# Patient Record
Sex: Female | Born: 1965
Health system: Southern US, Community
[De-identification: ages and names within clinical notes are randomized; demographics above are authoritative.]

## PROBLEM LIST (undated history)

## (undated) DIAGNOSIS — F329 Major depressive disorder, single episode, unspecified: Secondary | ICD-10-CM

## (undated) DIAGNOSIS — F32A Depression, unspecified: Secondary | ICD-10-CM

## (undated) DIAGNOSIS — T1491XA Suicide attempt, initial encounter: Secondary | ICD-10-CM

## (undated) DIAGNOSIS — K649 Unspecified hemorrhoids: Secondary | ICD-10-CM

## (undated) DIAGNOSIS — F419 Anxiety disorder, unspecified: Secondary | ICD-10-CM

## (undated) DIAGNOSIS — F1010 Alcohol abuse, uncomplicated: Secondary | ICD-10-CM

## (undated) DIAGNOSIS — G40909 Epilepsy, unspecified, not intractable, without status epilepticus: Secondary | ICD-10-CM

## (undated) HISTORY — PX: EXTERNAL EAR SURGERY: SHX627

## (undated) HISTORY — DX: Epilepsy, unspecified, not intractable, without status epilepticus: G40.909

## (undated) HISTORY — PX: HEAD & NECK SKIN LESION EXCISIONAL BIOPSY: SUR472

## (undated) HISTORY — PX: WISDOM TOOTH EXTRACTION: SHX21

---

## 2000-10-22 ENCOUNTER — Other Ambulatory Visit: Admission: RE | Admit: 2000-10-22 | Discharge: 2000-10-22 | Payer: Self-pay | Admitting: Obstetrics and Gynecology

## 2001-10-26 ENCOUNTER — Other Ambulatory Visit: Admission: RE | Admit: 2001-10-26 | Discharge: 2001-10-26 | Payer: Self-pay | Admitting: Gynecology

## 2002-08-03 ENCOUNTER — Encounter: Admission: RE | Admit: 2002-08-03 | Discharge: 2002-09-05 | Payer: Self-pay | Admitting: Family Medicine

## 2002-11-08 ENCOUNTER — Other Ambulatory Visit: Admission: RE | Admit: 2002-11-08 | Discharge: 2002-11-08 | Payer: Self-pay | Admitting: Gynecology

## 2005-03-18 ENCOUNTER — Other Ambulatory Visit: Admission: RE | Admit: 2005-03-18 | Discharge: 2005-03-18 | Payer: Self-pay | Admitting: Gynecology

## 2006-04-22 ENCOUNTER — Other Ambulatory Visit: Admission: RE | Admit: 2006-04-22 | Discharge: 2006-04-22 | Payer: Self-pay | Admitting: Gynecology

## 2006-05-21 ENCOUNTER — Encounter: Admission: RE | Admit: 2006-05-21 | Discharge: 2006-05-21 | Payer: Self-pay | Admitting: Obstetrics and Gynecology

## 2007-05-25 ENCOUNTER — Other Ambulatory Visit: Admission: RE | Admit: 2007-05-25 | Discharge: 2007-05-25 | Payer: Self-pay | Admitting: Gynecology

## 2008-06-21 ENCOUNTER — Ambulatory Visit: Payer: Self-pay | Admitting: Women's Health

## 2008-06-21 ENCOUNTER — Encounter: Payer: Self-pay | Admitting: Women's Health

## 2008-06-21 ENCOUNTER — Other Ambulatory Visit: Admission: RE | Admit: 2008-06-21 | Discharge: 2008-06-21 | Payer: Self-pay | Admitting: Gynecology

## 2008-12-26 ENCOUNTER — Emergency Department (HOSPITAL_COMMUNITY): Admission: EM | Admit: 2008-12-26 | Discharge: 2008-12-26 | Payer: Self-pay | Admitting: Emergency Medicine

## 2009-01-08 ENCOUNTER — Ambulatory Visit (HOSPITAL_COMMUNITY): Admission: RE | Admit: 2009-01-08 | Discharge: 2009-01-08 | Payer: Self-pay | Admitting: General Practice

## 2009-05-23 ENCOUNTER — Emergency Department (HOSPITAL_COMMUNITY): Admission: EM | Admit: 2009-05-23 | Discharge: 2009-05-23 | Payer: Self-pay | Admitting: Family Medicine

## 2009-05-24 ENCOUNTER — Inpatient Hospital Stay (HOSPITAL_COMMUNITY): Admission: EM | Admit: 2009-05-24 | Discharge: 2009-05-24 | Payer: Self-pay | Admitting: Emergency Medicine

## 2009-08-01 ENCOUNTER — Other Ambulatory Visit: Admission: RE | Admit: 2009-08-01 | Discharge: 2009-08-01 | Payer: Self-pay | Admitting: Gynecology

## 2009-08-01 ENCOUNTER — Ambulatory Visit: Payer: Self-pay | Admitting: Women's Health

## 2010-08-21 ENCOUNTER — Encounter: Payer: Self-pay | Admitting: Women's Health

## 2010-08-28 ENCOUNTER — Encounter: Payer: Self-pay | Admitting: Women's Health

## 2010-09-11 ENCOUNTER — Other Ambulatory Visit (HOSPITAL_COMMUNITY)
Admission: RE | Admit: 2010-09-11 | Discharge: 2010-09-11 | Disposition: A | Discharge status: Home / Self Care | Payer: BC Managed Care – PPO | Source: Ambulatory Visit | Attending: Gynecology | Admitting: Gynecology

## 2010-09-11 ENCOUNTER — Other Ambulatory Visit: Payer: Self-pay | Admitting: Women's Health

## 2010-09-11 ENCOUNTER — Encounter (INDEPENDENT_AMBULATORY_CARE_PROVIDER_SITE_OTHER): Payer: BC Managed Care – PPO | Admitting: Women's Health

## 2010-09-11 DIAGNOSIS — N912 Amenorrhea, unspecified: Secondary | ICD-10-CM

## 2010-09-11 DIAGNOSIS — Z124 Encounter for screening for malignant neoplasm of cervix: Secondary | ICD-10-CM | POA: Insufficient documentation

## 2010-09-11 DIAGNOSIS — R82998 Other abnormal findings in urine: Secondary | ICD-10-CM

## 2010-09-11 DIAGNOSIS — Z01419 Encounter for gynecological examination (general) (routine) without abnormal findings: Secondary | ICD-10-CM

## 2010-10-08 LAB — COMPREHENSIVE METABOLIC PANEL
AST: 32 U/L (ref 0–37)
CO2: 25 mEq/L (ref 19–32)
Calcium: 8.7 mg/dL (ref 8.4–10.5)
Chloride: 105 mEq/L (ref 96–112)
Creatinine, Ser: 0.8 mg/dL (ref 0.4–1.2)
GFR calc Af Amer: >60 mL/min (ref >60)
Potassium: 3.3 mEq/L — ABNORMAL LOW (ref 3.5–5.1)
Sodium: 135 mEq/L (ref 135–145)
Total Protein: 6.4 g/dL (ref 6.0–8.3)

## 2010-10-08 LAB — DIFFERENTIAL
Basophils Absolute: 0 10*3/uL (ref 0.0–0.1)
Basophils Relative: 0 % (ref 0–1)
Eosinophils Absolute: 0 10*3/uL (ref 0.0–0.7)
Eosinophils Relative: 0 % (ref 0–5)
Lymphocytes Relative: 6 % — ABNORMAL LOW (ref 12–46)

## 2010-10-13 LAB — COMPREHENSIVE METABOLIC PANEL WITH GFR
ALT: 20 U/L (ref 0–35)
AST: 37 U/L (ref 0–37)
Alkaline Phosphatase: 46 U/L (ref 39–117)
Calcium: 9.2 mg/dL (ref 8.4–10.5)
GFR calc Af Amer: >60 mL/min (ref >60)
Sodium: 136 meq/L (ref 135–145)
Total Bilirubin: 1.5 mg/dL — ABNORMAL HIGH (ref 0.3–1.2)
Total Protein: 6.7 g/dL (ref 6.0–8.3)

## 2010-10-13 LAB — COMPREHENSIVE METABOLIC PANEL
Albumin: 3.9 g/dL (ref 3.5–5.2)
BUN: 5 mg/dL — ABNORMAL LOW (ref 6–23)
CO2: 24 mEq/L (ref 19–32)
Chloride: 104 mEq/L (ref 96–112)
Creatinine, Ser: 0.68 mg/dL (ref 0.4–1.2)
GFR calc non Af Amer: >60 mL/min (ref >60)
Glucose, Bld: 105 mg/dL — ABNORMAL HIGH (ref 70–99)
Potassium: 2.8 mEq/L — ABNORMAL LOW (ref 3.5–5.1)

## 2010-10-13 LAB — URINE CULTURE
Colony Count: NO GROWTH
Culture: NO GROWTH

## 2010-10-13 LAB — DIFFERENTIAL
Basophils Absolute: 0 10*3/uL (ref 0.0–0.1)
Basophils Relative: 0 % (ref 0–1)
Eosinophils Absolute: 0 10*3/uL (ref 0.0–0.7)
Eosinophils Relative: 0 % (ref 0–5)
Lymphocytes Relative: 6 % — ABNORMAL LOW (ref 12–46)
Lymphs Abs: 0.7 10*3/uL (ref 0.7–4.0)
Monocytes Absolute: 0.5 10*3/uL (ref 0.1–1.0)
Monocytes Relative: 5 % (ref 3–12)
Neutro Abs: 9.8 K/uL — ABNORMAL HIGH (ref 1.7–7.7)
Neutrophils Relative %: 89 % — ABNORMAL HIGH (ref 43–77)

## 2010-10-13 LAB — URINALYSIS, ROUTINE W REFLEX MICROSCOPIC
Bilirubin Urine: NEGATIVE
Glucose, UA: 100 mg/dL — AB
Leukocytes, UA: NEGATIVE
Nitrite: NEGATIVE
Protein, ur: NEGATIVE mg/dL
Specific Gravity, Urine: 1.025 (ref 1.005–1.030)
Urobilinogen, UA: 0.2 mg/dL (ref 0.0–1.0)
pH: 5.5 (ref 5.0–8.0)

## 2010-10-13 LAB — URINE MICROSCOPIC-ADD ON

## 2010-10-13 LAB — RAPID URINE DRUG SCREEN, HOSP PERFORMED
Amphetamines: NOT DETECTED
Barbiturates: NOT DETECTED
Benzodiazepines: NOT DETECTED
Cocaine: NOT DETECTED
Opiates: NOT DETECTED
Tetrahydrocannabinol: NOT DETECTED

## 2010-10-13 LAB — CBC
HCT: 35.5 % — ABNORMAL LOW (ref 36.0–46.0)
Hemoglobin: 11.9 g/dL — ABNORMAL LOW (ref 12.0–15.0)
MCHC: 33.7 g/dL (ref 30.0–36.0)
MCV: 100.8 fL — ABNORMAL HIGH (ref 78.0–100.0)
Platelets: 217 10*3/uL (ref 150–400)
RBC: 3.52 MIL/uL — ABNORMAL LOW (ref 3.87–5.11)
RDW: 13.1 % (ref 11.5–15.5)
WBC: 11 K/uL — ABNORMAL HIGH (ref 4.0–10.5)

## 2010-10-13 LAB — POCT PREGNANCY, URINE: Preg Test, Ur: NEGATIVE

## 2010-10-13 LAB — ETHANOL: Alcohol, Ethyl (B): <5 mg/dL (ref 0–10)

## 2010-11-18 NOTE — Procedures (Signed)
REQUESTING PHYSICIAN:  Dr. Louanna Raw.   CLINICAL HISTORY:  A 45 year old woman with suspected seizure.  EEG is  for evaluation.  The patient is described as awake and drowsy.  This is  a routine EEG done with photic stimulation and hyperventilation.   DESCRIPTION:  Dominant rhythm in this tracing is a moderate-to-high  amplitude alpha rhythm of 10 Hz, which predominates posteriorly, appears  without abnormal asymmetry, and attenuates with eye opening and closing.  Low amplitude fast activity seen frontally and centrally.  Drowsiness  occurs naturally as evidenced by occasional fragmentation in the  background and generalized attenuation of rhythms, but drowsiness is not  prolonged and stage II sleep is not seen.  Throughout the recording,  both in awake and drowsiness, and during photic stimulation and  hyperventilation, apex of high amplitude delta activity is 3-4 Hz,  frontal predominance, appear.  These are often mixed with spike and  polyspike activity, which seems to emanate primarily from frontal and  central areas.  Thus the slowing or spike activity seems to have a  lateralizing predominance.  Her paroxysms last 1-2 seconds, and then  normal background is seen.  Photic stimulation produced symmetric driver  responses.  Hyperventilation produced increased prominence of the alpha.  Single channel devoted to EKG revealed sinus rhythm throughout the rate  of approximately 78 beats per minute.   CONCLUSIONS:  Abnormal study due to the presence of paroxysms of  generalized slowing and spike/slow activity, findings suggesting  underlying cortical irritability and potential epileptogenicity.  Findings of this type are characteristic seen in individuals with  various forms of generalized epilepsy.  Careful correlation with  clinical information is required.      Michael L. Thad Ranger, M.D.  Electronically Signed     QIO:NGEX  D:  01/08/2009 23:23:43  T:  01/09/2009 04:59:40   Job #:  [id number]

## 2012-05-06 DIAGNOSIS — F1011 Alcohol abuse, in remission: Secondary | ICD-10-CM | POA: Insufficient documentation

## 2012-05-06 DIAGNOSIS — G478 Other sleep disorders: Secondary | ICD-10-CM | POA: Insufficient documentation

## 2012-05-06 DIAGNOSIS — G2581 Restless legs syndrome: Secondary | ICD-10-CM | POA: Insufficient documentation

## 2012-05-06 DIAGNOSIS — G40309 Generalized idiopathic epilepsy and epileptic syndromes, not intractable, without status epilepticus: Secondary | ICD-10-CM | POA: Insufficient documentation

## 2012-10-06 ENCOUNTER — Encounter: Payer: Self-pay | Admitting: Women's Health

## 2012-10-06 ENCOUNTER — Other Ambulatory Visit (HOSPITAL_COMMUNITY)
Admission: RE | Admit: 2012-10-06 | Discharge: 2012-10-06 | Disposition: A | Discharge status: Home / Self Care | Payer: BC Managed Care – PPO | Source: Ambulatory Visit | Attending: Obstetrics and Gynecology | Admitting: Obstetrics and Gynecology

## 2012-10-06 ENCOUNTER — Ambulatory Visit (INDEPENDENT_AMBULATORY_CARE_PROVIDER_SITE_OTHER): Payer: BC Managed Care – PPO | Admitting: Women's Health

## 2012-10-06 VITALS — BP 116/74 | Ht 64.0 in | Wt 140.0 lb

## 2012-10-06 DIAGNOSIS — G40409 Other generalized epilepsy and epileptic syndromes, not intractable, without status epilepticus: Secondary | ICD-10-CM | POA: Insufficient documentation

## 2012-10-06 DIAGNOSIS — Z309 Encounter for contraceptive management, unspecified: Secondary | ICD-10-CM

## 2012-10-06 DIAGNOSIS — B009 Herpesviral infection, unspecified: Secondary | ICD-10-CM

## 2012-10-06 DIAGNOSIS — Z01419 Encounter for gynecological examination (general) (routine) without abnormal findings: Secondary | ICD-10-CM | POA: Insufficient documentation

## 2012-10-06 DIAGNOSIS — F411 Generalized anxiety disorder: Secondary | ICD-10-CM

## 2012-10-06 DIAGNOSIS — Z13220 Encounter for screening for lipoid disorders: Secondary | ICD-10-CM

## 2012-10-06 DIAGNOSIS — G40309 Generalized idiopathic epilepsy and epileptic syndromes, not intractable, without status epilepticus: Secondary | ICD-10-CM

## 2012-10-06 DIAGNOSIS — F32A Depression, unspecified: Secondary | ICD-10-CM | POA: Insufficient documentation

## 2012-10-06 DIAGNOSIS — F329 Major depressive disorder, single episode, unspecified: Secondary | ICD-10-CM

## 2012-10-06 DIAGNOSIS — IMO0001 Reserved for inherently not codable concepts without codable children: Secondary | ICD-10-CM

## 2012-10-06 LAB — CBC WITH DIFFERENTIAL/PLATELET
Basophils Absolute: 0 10*3/uL (ref 0.0–0.1)
Basophils Relative: 1 % (ref 0–1)
Eosinophils Absolute: 0.1 10*3/uL (ref 0.0–0.7)
Eosinophils Relative: 2 % (ref 0–5)
Hemoglobin: 13.4 g/dL (ref 12.0–15.0)
MCH: 31.7 pg (ref 26.0–34.0)
Monocytes Absolute: 0.5 10*3/uL (ref 0.1–1.0)
Neutrophils Relative %: 53 % (ref 43–77)
RBC: 4.23 MIL/uL (ref 3.87–5.11)
WBC: 6.1 10*3/uL (ref 4.0–10.5)

## 2012-10-06 LAB — LIPID PANEL
Cholesterol: 266 mg/dL — ABNORMAL HIGH (ref 0–200)
HDL: 87 mg/dL (ref >39)
LDL Cholesterol: 148 mg/dL — ABNORMAL HIGH (ref 0–99)
Triglycerides: 156 mg/dL — ABNORMAL HIGH (ref <150)
VLDL: 31 mg/dL (ref 0–40)

## 2012-10-06 MED ORDER — FLUOXETINE HCL 20 MG PO CAPS: 20.0000 mg | ORAL_CAPSULE | Freq: Every day | ORAL | Status: DC

## 2012-10-06 MED ORDER — VALACYCLOVIR HCL 500 MG PO TABS: 500.0000 mg | ORAL_TABLET | Freq: Two times a day (BID) | ORAL | Status: DC

## 2012-10-06 MED ORDER — NORETHINDRONE ACET-ETHINYL EST 1-20 MG-MCG PO TABS: 1.0000 | ORAL_TABLET | Freq: Every day | ORAL | Status: DC

## 2012-10-06 NOTE — Patient Instructions (Addendum)

## 2012-10-06 NOTE — Progress Notes (Signed)
Robin Henderson [birth year] [id number]    History:    The patient presents for annual exam.  Monthly cycle/same partner/rare intercourse. Normal screening mammogram in 07. Normal Pap history. HSV 1, rare outbreaks. History of depression, off prozac for 2 years, requests restarting Prozac. History of alcohol abuse, no alcohol for several years. Had been on Prozac 60 mg daily 2 years ago,  has had counseling. Seizure disorder/myoclonic epilepsy, on Lamictal 2010 Dr. Sunday Shams manages.   Past medical history, past surgical history, family history and social history were all reviewed and documented in the EPIC chart. 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   ROS:  A  ROS was performed and pertinent positives and negatives are included in the history.  Exam:  Filed Vitals:   10/06/12 1018  BP: 116/74    General appearance:  Normal Head/Neck:  Normal, without cervical or supraclavicular adenopathy. Thyroid:  Symmetrical, normal in size, without palpable masses or nodularity. Respiratory  Effort:  Normal  Auscultation:  Clear without wheezing or rhonchi Cardiovascular  Auscultation:  Regular rate, without rubs, murmurs or gallops  Edema/varicosities:  Not grossly evident Abdominal  Soft,nontender, without masses, guarding or rebound.  Liver/spleen:  No organomegaly noted  Hernia:  None appreciated  Skin  Inspection:  Grossly normal  Palpation:  Grossly normal Neurologic/psychiatric  Orientation:  Normal with appropriate conversation.  Mood/affect:  Normal  Genitourinary    Breasts: Examined lying and sitting.     Right: Without masses, retractions, discharge or axillary adenopathy.     Left: Without masses, retractions, discharge or axillary adenopathy.   Inguinal/mons:  Normal without inguinal adenopathy  External genitalia:  Normal  BUS/Urethra/Skene's glands:  Normal  Bladder:  Normal  Vagina:  Normal  Cervix:  Normal  Uterus:   normal in size, shape and contour.   Midline and mobile  Adnexa/parametria:     Rt: Without masses or tenderness.   Lt: Without masses or tenderness.  Anus and perineum: Normal  Digital rectal exam: Normal sphincter tone without palpated masses or tenderness  Assessment/Plan:  47 y.o. DWF G0  for annual exam.   HSV 1 Depression Contraception management  Plan: Counseling reviewed, declines need. Prozac 20 mg by mouth daily, continue regular exercise, healthy diet. Instructed to call if no relief of symptoms. SBE's, importance of an annual screen reviewed, instructed to schedule. Calcium rich diet, MVI daily encouraged. Contraception options reviewed, had been on trivora for many years, will try Loestrin 1/20 prescription, proper use, slight risk for blood clots, strokes reviewed and accepted. Condoms first month encouraged. CBC,  lipid panel, TSH, UA, Pap. Valtrex 500 twice daily for 3-5 days as needed prescription, proper use given and reviewed.    Harrington Challenger [facility], 12:33 PM 10/06/2012

## 2012-10-06 NOTE — Addendum Note (Signed)
Addended by: Richardson Chiquito on: 10/06/2012 03:02 PM   Modules accepted: Orders

## 2012-10-07 LAB — URINALYSIS W MICROSCOPIC + REFLEX CULTURE
Glucose, UA: NEGATIVE mg/dL
Specific Gravity, Urine: 1.028 (ref 1.005–1.030)
pH: 5.5 (ref 5.0–8.0)

## 2012-10-13 ENCOUNTER — Other Ambulatory Visit: Payer: Self-pay | Admitting: Women's Health

## 2012-10-13 DIAGNOSIS — E7800 Pure hypercholesterolemia, unspecified: Secondary | ICD-10-CM

## 2012-11-16 ENCOUNTER — Telehealth: Payer: Self-pay | Admitting: *Deleted

## 2012-11-16 DIAGNOSIS — N390 Urinary tract infection, site not specified: Secondary | ICD-10-CM

## 2012-11-16 MED ORDER — SULFAMETHOXAZOLE-TRIMETHOPRIM 800-160 MG PO TABS: 1.0000 | ORAL_TABLET | Freq: Two times a day (BID) | ORAL | Status: DC

## 2012-11-16 NOTE — Telephone Encounter (Signed)
Pt called requesting medication for UTI, c/o urgency, burning with urination, lower back discomfort. Pt said you normally would give her a rx for this yearly but she has not had a UTI in some time now. Please advise

## 2012-11-16 NOTE — Telephone Encounter (Signed)
Best if we can check UA, but if unable Septra twice daily for 3 days.check TOC after antibiotic

## 2012-11-16 NOTE — Telephone Encounter (Signed)
rx sent to pharmacy, pt couldn't come today, order place for recheck she will make lab appt for recheck.

## 2012-11-18 ENCOUNTER — Telehealth: Payer: Self-pay | Admitting: Neurology

## 2012-11-21 ENCOUNTER — Ambulatory Visit (INDEPENDENT_AMBULATORY_CARE_PROVIDER_SITE_OTHER): Payer: BC Managed Care – PPO | Admitting: Neurology

## 2012-11-21 ENCOUNTER — Encounter: Payer: Self-pay | Admitting: Neurology

## 2012-11-21 ENCOUNTER — Telehealth: Payer: Self-pay | Admitting: Neurology

## 2012-11-21 VITALS — BP 136/81 | HR 85 | Ht 65.0 in | Wt 134.0 lb

## 2012-11-21 DIAGNOSIS — G40309 Generalized idiopathic epilepsy and epileptic syndromes, not intractable, without status epilepticus: Secondary | ICD-10-CM

## 2012-11-21 DIAGNOSIS — G40409 Other generalized epilepsy and epileptic syndromes, not intractable, without status epilepticus: Secondary | ICD-10-CM

## 2012-11-21 MED ORDER — LAMOTRIGINE ER 200 MG PO TB24: 200.0000 mg | ORAL_TABLET | ORAL | Status: DC

## 2012-11-21 NOTE — Telephone Encounter (Signed)
I spoke with patient and she is coming in today at 1430 to see Dr. Vickey Huger.

## 2012-11-21 NOTE — Progress Notes (Addendum)
[facility]  Provider:  Dr Arma Reining Referring Provider:Primary Care Physician:  Sabino Snipes . np  Chief Complaint  Patient presents with  . Seizures    RM#10    HPI:  Robin Henderson is a 47 y.o. female here as a referral from Dr. Maryelizabeth Rowan . Patient is currently on Prozac, 20 mg daily Lamictal 100 mg by mouth daily Loestrin 1/20 mg microgram. Bactrim 800 mg twice daily beginning 11/16/2012 and Valtrex as needed 5 mg patient. The patient has been seen for about 4/2 years of this practice in the diet after she initially had a cluster of several seizures or. 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 She was briefly hospitalized once for seizures and her EEG showed abnormal generalized discharges as polyspike wave pattern.  She presents today after 25 months of seizure freedom With another seizure that happened in her whole, she relates that at the same time she suffered a urinary tract infection, was dehydrated , not feeling well and had been placed on Cipro- which may have also played a roll in her metabolism. Felt to the  ground and scratched her right knee in the seizure and had to abrasions to her right face at the eyebrow and the zygomatic arch.  The emergency services came to the home  but did not recommend to seek further care at the emergency room. She was again placed on driving restrictions for 6 month. She understood that she needs to h be seizure free for that period.     Review of Systems: Out of a complete 14 system review, the patient complains of only the following symptoms, and all other reviewed systems are negative. Recent seizure , after Vomiting, fever and chills , insomnia,  UTI ,dehydration.    History   Social History  . Marital Status: Single    Spouse Name: N/A    Number of Children: N/A  . Years of Education: N/A    Occupational History  . Not on file.   Social History Main Topics  . Smoking status: Never Smoker   . Smokeless tobacco: Never Used  . Alcohol Use: No  . Drug Use: Not on file  . Sexually Active: Not Currently    Birth Control/ Protection: None   Other Topics Concern  . Not on file   Social History Narrative  . No narrative on file    Family History  Problem Relation Age of Onset  . Heart disease Mother   . Epilepsy Sister   . Epilepsy Brother     Past Medical History  Diagnosis Date  . Epilepsy     No past surgical history on file.  Current Outpatient Prescriptions  Medication Sig Dispense Refill  . FLUoxetine (PROZAC) 20 MG capsule Take 1 capsule (20 mg total) by mouth daily.  30 capsule  11  . lamoTRIgine (LAMICTAL) 100 MG tablet Take 100 mg by mouth daily.      . norethindrone-ethinyl estradiol (MICROGESTIN,JUNEL,LOESTRIN) 1-20 MG-MCG tablet Take 1 tablet by mouth daily.  1 Package  12  . sulfamethoxazole-trimethoprim (BACTRIM DS,SEPTRA DS) 800-160 MG per tablet Take 1 tablet by mouth 2 (two) times daily.  6 tablet  0  . valACYclovir (VALTREX) 500 MG tablet Take 500 mg by mouth as needed.       No current facility-administered medications for this visit.    Allergies  as of 11/21/2012  . (No Known Allergies)    Vitals: BP 136/81  Pulse 85  Ht 5\' 5"  (1.651 m)  Wt 134 lb (60.782 kg)  BMI 22.3 kg/m2 Last Weight:  Wt Readings from Last 1 Encounters:  11/21/12 134 lb (60.782 kg)   Last Height:   Ht Readings from Last 1 Encounters:  11/21/12 5\' 5"  (1.651 m)   Vision Screening:   Physical exam:  General: The patient is awake, alert and appears not in acute distress. The patient is well groomed. Head: Normocephalic, atraumatic. Neck is supple. Mallampati1,  Cardiovascular:  Regular rate and rhythm, without  murmurs or carotid bruit, and without distended neck veins. Respiratory: Lungs are clear to auscultation. Skin:  Without evidence of edema, or  rash- has bruising over the right  knee.  tanned.  Trunk: BMI is normal.  Neurologic exam : The patient is awake and alert, oriented to place and time.  Memory subjective  described as intact. There is a normal attention span & concentration ability. Speech is fluent without  dysarthria, dysphonia or aphasia.  Mood and affect are appropriate.  Cranial nerves: Pupils are equal and briskly reactive to light. Funduscopic exam without  evidence of pallor or edema. Extraocular movements  in vertical and horizontal planes intact and without nystagmus. Visual fields by finger perimetry are intact. Hearing to finger rub intact.  Facial sensation intact to fine touch. Facial motor strength is symmetric and tongue and uvula move midline. No tongue bite   Motor exam: Normal tone and normal muscle bulk and symmetric normal strength in all extremities.  Sensory:  Fine touch, pinprick and vibration were tested in all extremities. Proprioception is tested in the upper extremities only.   Coordination: Rapid alternating movements in the fingers/hands is tested and normal. Finger-to-nose maneuver tested and normal without evidence of ataxia, dysmetria or tremor.  Gait and station: Patient walks without assistive device and is able and assisted stool climb up to the exam table. Strength within normal limits. Stance is stable and normal.  Steps are unfragmented. Romberg testing is normal.  Deep tendon reflexes: in the  upper and lower extremities are symmetric and intact. Babinski maneuver response is  downgoing.   Assessment:  After physical and neurologic examination, review of laboratory studies, imaging, neurophysiology testing and pre-existing records, assessment will be reviewed on the problem list.  Plan:  Treatment plan and additional workup will be reviewed under Problem List.   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  Lamictal to be increased to 150 mg daily.  She is not  drinking .

## 2012-11-21 NOTE — Telephone Encounter (Signed)
I spoke to patient who had a seizure on Thurs of last week, with lots of vomiting.  She has not had a seizure in 25 months.  She said she has lost about 24lbs, and is going through a lot of stress in her personal life, also got a UTI and was prescribed Cipro by her PCP.  She wants to know if she can come in to see Dr. Vickey Huger soon, she is traveling tomorrow afternoon to [place] until Friday.

## 2012-11-21 NOTE — Patient Instructions (Addendum)
Seizure, Adult A seizure means there is unusual activity in the brain. A seizure can cause changes in attention or behavior. Seizures often cause shaking (convulsions). Seizures often last from 30 seconds to 2 minutes. HOME CARE   If you are given medicines, take them exactly as told by your doctor.  Keep all doctor visits as told.  Do not swim or drive until your doctor says it is okay.  Teach others what to do if you have a seizure. They should:  Lay you on the ground.  Put a cushion under your head.  Loosen any tight clothing around your neck.  Turn you on your side.  Stay with you until you get better. GET HELP RIGHT AWAY IF:   The seizure lasts longer than 2 to 5 minutes.  The seizure is very bad.  The person does not wake up after the seizure.  The person's attention or behavior changes. Drive the person to the emergency room or call your local emergency services (911 in [place] MAKE SURE YOU:   Understand these instructions.  Will watch your condition.  Will get help right away if you are not doing well or get worse. Document Released: 12/09/2007 Document Revised: 09/14/2011 Document Reviewed: 06/10/2011 [facility] Patient Information 2013 [place], [place]. Driving and Equipment Restrictions Some medical problems make it dangerous to drive, ride a bike, or use machines. Some of these problems are:  A hard blow to the head (concussion).  Passing out (fainting).  Twitching and shaking (seizures).  Low blood sugar.  Taking medicine to help you relax (sedatives).  Taking pain medicines.  Wearing an eye patch.  Wearing splints. This can make it hard to use parts of your body that you need to drive safely. HOME CARE   Do not drive until your doctor says it is okay.  Do not use machines until your doctor says it is okay. You may need a form signed by your doctor (medical release) before you can drive again. You may also need this form before you do other  tasks where you need to be fully alert. MAKE SURE YOU:  Understand these instructions.  Will watch your condition.  Will get help right away if you are not doing well or get worse. Document Released: 07/30/2004 Document Revised: 09/14/2011 Document Reviewed: 10/30/2009 [facility] Patient Information 2013 [place], [place].

## 2012-11-21 NOTE — Telephone Encounter (Signed)
Patient coming in today 11-21-12 at 1430 to see Dr. Vickey Huger.

## 2012-11-21 NOTE — Telephone Encounter (Signed)
Robin Henderson . Please call patient with appointment [place] this PM , 14.30 hours. Dae Highley, MD

## 2012-11-23 ENCOUNTER — Telehealth: Payer: Self-pay

## 2012-11-23 ENCOUNTER — Telehealth: Payer: Self-pay | Admitting: Women's Health

## 2012-11-23 DIAGNOSIS — G40309 Generalized idiopathic epilepsy and epileptic syndromes, not intractable, without status epilepticus: Secondary | ICD-10-CM

## 2012-11-23 DIAGNOSIS — G40409 Other generalized epilepsy and epileptic syndromes, not intractable, without status epilepticus: Secondary | ICD-10-CM

## 2012-11-23 NOTE — Telephone Encounter (Signed)
[facility] pharmacy sent a fax asking to verify the Lamotrigine Rx.  OV note says patient will increase to 150mg  daily.  Rx sent was for 200mg  1 po 1 day or 1 dose.  Call back number [phone] or updated rx can be e-scribed or faxed to [phone].  Please advise.

## 2012-11-23 NOTE — Telephone Encounter (Signed)
Patient will use up her current 100 mg tabs by increasing the dose to 150 ( 1 and one half tab ) a day. Than refill at 200 mg daily .

## 2012-11-23 NOTE — Telephone Encounter (Signed)
Message left,  had seizure

## 2012-11-24 MED ORDER — LAMOTRIGINE ER 200 MG PO TB24: 200.0000 mg | ORAL_TABLET | Freq: Every day | ORAL | Status: DC

## 2012-11-24 NOTE — Progress Notes (Signed)
Message left to call if needed

## 2013-01-30 ENCOUNTER — Telehealth: Payer: Self-pay | Admitting: *Deleted

## 2013-01-30 MED ORDER — HYDROCORTISONE ACE-PRAMOXINE 1-1 % RE CREA: TOPICAL_CREAM | Freq: Two times a day (BID) | RECTAL | Status: DC

## 2013-01-30 NOTE — Telephone Encounter (Signed)
Pt called c/o very bad Hemorrhoids, she has had them for years. Pt said the one now that has flared up is painful, hurts to lay down, sitting. Pt asked there is any female MD she could see for this? And also if any Rx that you could give her to help with relief? She has tried the OTC and no relief. Please advise

## 2013-01-30 NOTE — Telephone Encounter (Signed)
Please call and Analpram HC use 3 times per day, [facility] prescription with refill. Dr. Loreta Ave is female GI, often is a Careers adviser who removes hemorrhoids.

## 2013-01-30 NOTE — Telephone Encounter (Signed)
Pt informed with the below note, # given for Dr.Mann, rx sent.

## 2013-02-07 ENCOUNTER — Telehealth: Payer: Self-pay | Admitting: Neurology

## 2013-02-10 NOTE — Telephone Encounter (Signed)
Patient says she had a sz on Tue. She just wanted to inform Dr. Vickey Huger. She says she was traveling, missed taking medication 3 times, was not sleeping or eating well, and did have caffeine (coffee), which she does not have normally. She has not had another sz since.

## 2013-02-14 ENCOUNTER — Telehealth: Payer: Self-pay | Admitting: *Deleted

## 2013-02-14 NOTE — Telephone Encounter (Signed)
(  Pt aware you are out of the office) she would like to talk about increasing her Prozac 20 mg. She just got out of a 6 year relationship. Please advise

## 2013-02-20 NOTE — Telephone Encounter (Signed)
Message left on cell and work number

## 2013-02-21 ENCOUNTER — Other Ambulatory Visit: Payer: Self-pay | Admitting: Women's Health

## 2013-02-21 DIAGNOSIS — F411 Generalized anxiety disorder: Secondary | ICD-10-CM

## 2013-02-21 MED ORDER — FLUOXETINE HCL 20 MG PO CAPS: 60.0000 mg | ORAL_CAPSULE | Freq: Every day | ORAL | Status: DC

## 2013-02-21 NOTE — Telephone Encounter (Signed)
Telephone call, reviewed counseling, will schedule appointment, [place] [facility] number given, has had counseling in the past but would like to try a new counselor. Has been on Prozac 60, will call in refill. Denies suicidal ideation.

## 2013-04-27 ENCOUNTER — Inpatient Hospital Stay (HOSPITAL_COMMUNITY)
Admission: EM | Admit: 2013-04-27 | Discharge: 2013-04-30 | DRG: 897 | Disposition: A | Discharge status: Home / Self Care | Payer: BC Managed Care – PPO | Attending: Internal Medicine | Admitting: Internal Medicine

## 2013-04-27 ENCOUNTER — Encounter (HOSPITAL_COMMUNITY): Payer: Self-pay | Admitting: Emergency Medicine

## 2013-04-27 DIAGNOSIS — F1020 Alcohol dependence, uncomplicated: Secondary | ICD-10-CM | POA: Diagnosis present

## 2013-04-27 DIAGNOSIS — G40409 Other generalized epilepsy and epileptic syndromes, not intractable, without status epilepticus: Secondary | ICD-10-CM

## 2013-04-27 DIAGNOSIS — G2581 Restless legs syndrome: Secondary | ICD-10-CM

## 2013-04-27 DIAGNOSIS — F341 Dysthymic disorder: Secondary | ICD-10-CM

## 2013-04-27 DIAGNOSIS — G478 Other sleep disorders: Secondary | ICD-10-CM

## 2013-04-27 DIAGNOSIS — R569 Unspecified convulsions: Secondary | ICD-10-CM

## 2013-04-27 DIAGNOSIS — F32A Depression, unspecified: Secondary | ICD-10-CM | POA: Diagnosis present

## 2013-04-27 DIAGNOSIS — T1491XA Suicide attempt, initial encounter: Secondary | ICD-10-CM | POA: Insufficient documentation

## 2013-04-27 DIAGNOSIS — F1010 Alcohol abuse, uncomplicated: Secondary | ICD-10-CM | POA: Insufficient documentation

## 2013-04-27 DIAGNOSIS — E8729 Other acidosis: Secondary | ICD-10-CM

## 2013-04-27 DIAGNOSIS — E872 Acidosis, unspecified: Secondary | ICD-10-CM | POA: Diagnosis present

## 2013-04-27 DIAGNOSIS — R404 Transient alteration of awareness: Secondary | ICD-10-CM | POA: Diagnosis present

## 2013-04-27 DIAGNOSIS — R7309 Other abnormal glucose: Secondary | ICD-10-CM | POA: Diagnosis present

## 2013-04-27 DIAGNOSIS — F10239 Alcohol dependence with withdrawal, unspecified: Secondary | ICD-10-CM

## 2013-04-27 DIAGNOSIS — F411 Generalized anxiety disorder: Secondary | ICD-10-CM | POA: Diagnosis present

## 2013-04-27 DIAGNOSIS — F10939 Alcohol use, unspecified with withdrawal, unspecified: Principal | ICD-10-CM

## 2013-04-27 DIAGNOSIS — R739 Hyperglycemia, unspecified: Secondary | ICD-10-CM

## 2013-04-27 DIAGNOSIS — D7589 Other specified diseases of blood and blood-forming organs: Secondary | ICD-10-CM

## 2013-04-27 DIAGNOSIS — G40309 Generalized idiopathic epilepsy and epileptic syndromes, not intractable, without status epilepticus: Secondary | ICD-10-CM | POA: Diagnosis present

## 2013-04-27 DIAGNOSIS — E876 Hypokalemia: Secondary | ICD-10-CM

## 2013-04-27 DIAGNOSIS — F329 Major depressive disorder, single episode, unspecified: Secondary | ICD-10-CM | POA: Diagnosis present

## 2013-04-27 DIAGNOSIS — F3289 Other specified depressive episodes: Secondary | ICD-10-CM | POA: Diagnosis present

## 2013-04-27 DIAGNOSIS — B009 Herpesviral infection, unspecified: Secondary | ICD-10-CM

## 2013-04-27 DIAGNOSIS — I10 Essential (primary) hypertension: Secondary | ICD-10-CM | POA: Diagnosis present

## 2013-04-27 DIAGNOSIS — F1011 Alcohol abuse, in remission: Secondary | ICD-10-CM

## 2013-04-27 DIAGNOSIS — E785 Hyperlipidemia, unspecified: Secondary | ICD-10-CM | POA: Diagnosis present

## 2013-04-27 DIAGNOSIS — G40909 Epilepsy, unspecified, not intractable, without status epilepticus: Secondary | ICD-10-CM | POA: Insufficient documentation

## 2013-04-27 HISTORY — DX: Alcohol abuse, uncomplicated: F10.10

## 2013-04-27 HISTORY — DX: Unspecified hemorrhoids: K64.9

## 2013-04-27 HISTORY — DX: Suicide attempt, initial encounter: T14.91XA

## 2013-04-27 HISTORY — DX: Depression, unspecified: F32.A

## 2013-04-27 HISTORY — DX: Major depressive disorder, single episode, unspecified: F32.9

## 2013-04-27 HISTORY — DX: Anxiety disorder, unspecified: F41.9

## 2013-04-27 LAB — CBC
HCT: 34.8 % — ABNORMAL LOW (ref 36.0–46.0)
Hemoglobin: 12.3 g/dL (ref 12.0–15.0)
MCHC: 35.3 g/dL (ref 30.0–36.0)
MCV: 102.7 fL — ABNORMAL HIGH (ref 78.0–100.0)
RDW: 14.1 % (ref 11.5–15.5)
WBC: 9.9 10*3/uL (ref 4.0–10.5)

## 2013-04-27 LAB — BASIC METABOLIC PANEL
BUN: 10 mg/dL (ref 6–23)
CO2: 18 mEq/L — ABNORMAL LOW (ref 19–32)
Creatinine, Ser: 0.72 mg/dL (ref 0.50–1.10)
GFR calc Af Amer: >90 mL/min (ref >90)
GFR calc non Af Amer: >90 mL/min (ref >90)
Glucose, Bld: 150 mg/dL — ABNORMAL HIGH (ref 70–99)
Potassium: 3.6 mEq/L (ref 3.5–5.1)

## 2013-04-27 LAB — RAPID URINE DRUG SCREEN, HOSP PERFORMED
Barbiturates: NOT DETECTED
Benzodiazepines: NOT DETECTED
Opiates: NOT DETECTED
Tetrahydrocannabinol: NOT DETECTED

## 2013-04-27 LAB — GLUCOSE, CAPILLARY
Glucose-Capillary: 157 mg/dL — ABNORMAL HIGH (ref 70–99)
Glucose-Capillary: 92 mg/dL (ref 70–99)

## 2013-04-27 MED ORDER — THIAMINE HCL 100 MG/ML IJ SOLN
Freq: Once | INTRAVENOUS | Status: AC
  Administered 2013-04-27: 22:00:00 via INTRAVENOUS
  Filled 2013-04-27: qty 1000

## 2013-04-27 MED ORDER — LORAZEPAM 2 MG/ML IJ SOLN: 0.0000 mg | Freq: Four times a day (QID) | INTRAMUSCULAR | Status: DC

## 2013-04-27 MED ORDER — FOLIC ACID 1 MG PO TABS: 1.0000 mg | ORAL_TABLET | Freq: Every day | ORAL | Status: DC

## 2013-04-27 MED ORDER — FOLIC ACID 1 MG PO TABS
1.0000 mg | ORAL_TABLET | Freq: Every day | ORAL | Status: DC
  Administered 2013-04-28 – 2013-04-30 (×3): 1 mg via ORAL
  Filled 2013-04-27 (×3): qty 1

## 2013-04-27 MED ORDER — POLYETHYLENE GLYCOL 3350 17 G PO PACK
17.0000 g | PACK | Freq: Every day | ORAL | Status: DC | PRN
  Filled 2013-04-27: qty 1

## 2013-04-27 MED ORDER — THIAMINE HCL 100 MG/ML IJ SOLN
100.0000 mg | Freq: Every day | INTRAMUSCULAR | Status: DC
  Filled 2013-04-27 (×3): qty 1

## 2013-04-27 MED ORDER — THIAMINE HCL 100 MG/ML IJ SOLN: 100.0000 mg | Freq: Every day | INTRAMUSCULAR | Status: DC

## 2013-04-27 MED ORDER — LORAZEPAM 2 MG/ML IJ SOLN: 2.0000 mg | Freq: Once | INTRAMUSCULAR | Status: DC

## 2013-04-27 MED ORDER — ONDANSETRON HCL 4 MG/2ML IJ SOLN
4.0000 mg | Freq: Once | INTRAMUSCULAR | Status: AC
  Administered 2013-04-27: 4 mg via INTRAVENOUS
  Filled 2013-04-27: qty 2

## 2013-04-27 MED ORDER — VITAMIN B-1 100 MG PO TABS: 100.0000 mg | ORAL_TABLET | Freq: Every day | ORAL | Status: DC

## 2013-04-27 MED ORDER — LORAZEPAM 2 MG/ML IJ SOLN
0.0000 mg | INTRAMUSCULAR | Status: DC
  Administered 2013-04-28: 2 mg via INTRAVENOUS
  Filled 2013-04-27: qty 1

## 2013-04-27 MED ORDER — ADULT MULTIVITAMIN W/MINERALS CH
1.0000 | ORAL_TABLET | Freq: Every day | ORAL | Status: DC
  Administered 2013-04-28 – 2013-04-30 (×3): 1 via ORAL
  Filled 2013-04-27 (×3): qty 1

## 2013-04-27 MED ORDER — LORAZEPAM 2 MG/ML IJ SOLN: 0.0000 mg | Freq: Two times a day (BID) | INTRAMUSCULAR | Status: DC

## 2013-04-27 MED ORDER — ACETAMINOPHEN 325 MG PO TABS: 650.0000 mg | ORAL_TABLET | Freq: Four times a day (QID) | ORAL | Status: DC | PRN

## 2013-04-27 MED ORDER — VITAMIN B-1 100 MG PO TABS
100.0000 mg | ORAL_TABLET | Freq: Every day | ORAL | Status: DC
  Administered 2013-04-28 – 2013-04-30 (×3): 100 mg via ORAL
  Filled 2013-04-27 (×3): qty 1

## 2013-04-27 MED ORDER — ENOXAPARIN SODIUM 40 MG/0.4ML ~~LOC~~ SOLN
40.0000 mg | SUBCUTANEOUS | Status: DC
  Administered 2013-04-28: 40 mg via SUBCUTANEOUS
  Filled 2013-04-27 (×2): qty 0.4

## 2013-04-27 MED ORDER — LORAZEPAM 2 MG/ML IJ SOLN
1.0000 mg | Freq: Once | INTRAMUSCULAR | Status: AC
  Administered 2013-04-27: 1 mg via INTRAVENOUS
  Filled 2013-04-27: qty 1

## 2013-04-27 MED ORDER — LAMOTRIGINE ER 200 MG PO TB24: 200.0000 mg | ORAL_TABLET | Freq: Every day | ORAL | Status: DC

## 2013-04-27 MED ORDER — HYDROCORTISONE ACE-PRAMOXINE 1-1 % RE FOAM
1.0000 | Freq: Two times a day (BID) | RECTAL | Status: DC | PRN
Start: 2013-04-27 — End: 2013-04-30
  Administered 2013-04-28: 1 via RECTAL
  Filled 2013-04-27: qty 10

## 2013-04-27 MED ORDER — FLUOXETINE HCL 20 MG PO CAPS
60.0000 mg | ORAL_CAPSULE | Freq: Every day | ORAL | Status: DC
  Administered 2013-04-27 – 2013-04-30 (×4): 60 mg via ORAL
  Filled 2013-04-27 (×4): qty 3

## 2013-04-27 MED ORDER — LAMOTRIGINE 200 MG PO TABS
200.0000 mg | ORAL_TABLET | Freq: Every day | ORAL | Status: DC
  Administered 2013-04-28 – 2013-04-29 (×3): 200 mg via ORAL
  Filled 2013-04-27 (×5): qty 1

## 2013-04-27 MED ORDER — LORAZEPAM 2 MG/ML IJ SOLN
2.0000 mg | Freq: Once | INTRAMUSCULAR | Status: AC
  Administered 2013-04-27: 2 mg via INTRAVENOUS
  Filled 2013-04-27: qty 1

## 2013-04-27 MED ORDER — HYDROCORTISONE ACE-PRAMOXINE 1-1 % RE CREA
TOPICAL_CREAM | Freq: Two times a day (BID) | RECTAL | Status: DC | PRN
  Filled 2013-04-27: qty 30

## 2013-04-27 MED ORDER — NORETHINDRONE ACET-ETHINYL EST 1-20 MG-MCG PO TABS
1.0000 | ORAL_TABLET | Freq: Every day | ORAL | Status: DC
  Administered 2013-04-27 – 2013-04-28 (×2): 1 via ORAL

## 2013-04-27 MED ORDER — SODIUM CHLORIDE 0.9 % IV BOLUS (SEPSIS)
1000.0000 mL | Freq: Once | INTRAVENOUS | Status: AC
  Administered 2013-04-27: 1000 mL via INTRAVENOUS

## 2013-04-27 MED ORDER — LORAZEPAM 1 MG PO TABS
0.0000 mg | ORAL_TABLET | ORAL | Status: DC
  Administered 2013-04-27 – 2013-04-28 (×3): 2 mg via ORAL
  Filled 2013-04-27 (×3): qty 2

## 2013-04-27 MED ORDER — LORAZEPAM 2 MG/ML IJ SOLN
2.0000 mg | INTRAMUSCULAR | Status: DC | PRN
  Filled 2013-04-27: qty 1

## 2013-04-27 MED ORDER — KCL IN DEXTROSE-NACL 10-5-0.45 MEQ/L-%-% IV SOLN
INTRAVENOUS | Status: DC
  Administered 2013-04-28: 09:00:00 via INTRAVENOUS
  Filled 2013-04-27 (×4): qty 1000

## 2013-04-27 MED ORDER — ADULT MULTIVITAMIN W/MINERALS CH: 1.0000 | ORAL_TABLET | Freq: Every day | ORAL | Status: DC

## 2013-04-27 MED ORDER — LORAZEPAM 1 MG PO TABS: 1.0000 mg | ORAL_TABLET | Freq: Four times a day (QID) | ORAL | Status: DC | PRN

## 2013-04-27 MED ORDER — LORAZEPAM 2 MG/ML IJ SOLN: 1.0000 mg | Freq: Four times a day (QID) | INTRAMUSCULAR | Status: DC | PRN

## 2013-04-27 MED ORDER — ACETAMINOPHEN 650 MG RE SUPP: 650.0000 mg | Freq: Four times a day (QID) | RECTAL | Status: DC | PRN

## 2013-04-27 MED ORDER — SODIUM CHLORIDE 0.9 % IJ SOLN
3.0000 mL | Freq: Two times a day (BID) | INTRAMUSCULAR | Status: DC
  Administered 2013-04-29: 3 mL via INTRAVENOUS

## 2013-04-27 NOTE — Progress Notes (Addendum)
   CARE MANAGEMENT ED NOTE 04/27/2013  Patient:  Robin Henderson, Robin Henderson   Account Number:  [id number]  Date Initiated:  04/27/2013  Documentation initiated by:  Edd Arbour  Subjective/Objective Assessment:   47 yr old female [employer] [place] [employer] without pcp listed c/o seizure     Subjective/Objective Assessment Detail:     Action/Plan:   spoke with pt who states she primarily sees her OB GYN (Dr Maryelizabeth Rowan)  Only Provided with lists of internal medicine providers and neurologists in [place] code [id number]   Action/Plan Detail:   Anticipated DC Date:       Status Recommendation to Physician:   Result of Recommendation:    Other ED Services  Consult Working Plan    DC Planning Services  Other  PCP issues  Outpatient Services - Pt will follow up    Choice offered to / List presented to:            Status of service:  Completed, signed off  ED Comments:   ED Comments Detail:

## 2013-04-27 NOTE — ED Notes (Signed)
Bed: ZO10 Expected date:  Expected time:  Means of arrival:  Comments: ems- seizure

## 2013-04-27 NOTE — H&P (Addendum)
[facility] History and Physical  Robin Henderson WJX:[id number] DOB: 11-29-1965 DOA: 04/27/2013  Referring physician:  Lorre Nick PCP:  No primary provider on file.  Does have PCP.  Chief Complaint:  seizures  HPI:  The patient is a 47 y.o. year-old female with history of grand mal seizures, depression and anxiety with two previous suicide attempts, alcohol dependence who presents with seizures and alcohol withdrawal.  The patient was last at their baseline health a few days ago.    Seizures:  Seizures were diagnosed about 5-6 years ago and are grand mal seizures that last about 45 to 60 seconds and are associated with tongue biting and urinary incontinence.  She has been on lamictal but having seizures about every month for the last few months so her dose was increased to 200mg  daily.  Today, she was at work and had a 3-second witnessed seizure.  She was assisted to the floor and did not fall or hit her head.  She denies recent head trauma or falls.  EMS transported her to the emergency department.  EtOH:  For the last 12 years, she has had alcohol daily and she is currently drinking more than she ever has.  She drinks 4-5 large glasses of wine daily and about a quarter of a fifth of vodka per day.  She decided to quit alcohol altogether because she is making some life changes and her last drink was last night.  Her alcohol level in the ER was < 11 but she was experiencing shakes, tremors, tongue fasciculations, and hypertension.  She received 3 mg of IV ativan in the ER with some relief.    Labs were notable for CO2 of 18, glucose of 150, but were otherwise normal.  UDS was negative.    Review of Systems:  General:  Denies fevers, chills, weight loss or gain HEENT:  Denies changes to hearing and vision, rhinorrhea, sinus congestion, sore throat CV:  Denies chest pain and palpitations, lower extremity edema.  PULM:  Denies SOB, wheezing, cough.   GI:  + nausea, vomiting in the  ER. GU:  Denies dysuria, frequency, urgency ENDO:  Denies polyuria, polydipsia.   HEME:  Denies hematemesis, blood in stools, melena, abnormal bruising or bleeding.  LYMPH:  Denies lymphadenopathy.   MSK:  Denies arthralgias, myalgias.   DERM:  Denies skin rash or ulcer.   NEURO:  Denies focal numbness, weakness, slurred speech, confusion, facial droop.  PSYCH:  Has extreme anxiety and depression.  Currently denies suicidal ideation.  Last suicide attempt was a couple months ago.    Past Medical History  Diagnosis Date  . 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 Anxiety and depression   . Suicide attempt     x 2.  Almost slit wrists and almost jumped off a parking deck  . Hemorrhoids   . Alcohol abuse    Past Surgical History  Procedure Laterality Date  . External ear surgery      correct earing hole  . Head & neck skin lesion excisional biopsy      lip lesion   Social History:  reports that she has never smoked. She has never used smokeless tobacco. She reports that she drinks about 4.2 ounces of alcohol per week. She reports that she does not use illicit drugs. Works.  Moving to new apartment to be nearer her boyfriend.    No Known Allergies  Family History  Problem Relation Age of Onset  .  Heart disease Mother   . Epilepsy Sister   . Epilepsy Brother   . Depression Brother     with suicide attempts  . Depression Sister     possible suicide attempt  . Alcohol abuse Mother   . Alcohol abuse Maternal Grandfather      Prior to Admission medications   Medication Sig Start Date End Date Taking? Authorizing Provider  acetaminophen (TYLENOL) 325 MG tablet Take 650 mg by mouth every 6 (six) hours as needed for pain.   Yes Historical Provider, MD  FLUoxetine (PROZAC) 20 MG capsule Take 3 capsules (60 mg total) by mouth daily. 02/21/13  Yes Harrington Challenger, NP  LamoTRIgine 200 MG TB24 Take 1 tablet (200 mg total) by mouth daily. 11/24/12  Yes Carmen Dohmeier, MD   norethindrone-ethinyl estradiol (MICROGESTIN,JUNEL,LOESTRIN) 1-20 MG-MCG tablet Take 1 tablet by mouth daily. 10/06/12  Yes Harrington Challenger, NP  pramoxine-hydrocortisone [facility]) 1-1 % rectal cream Place rectally 2 (two) times daily. 01/30/13  Yes Harrington Challenger, NP  valACYclovir (VALTREX) 500 MG tablet Take 500 mg by mouth as needed (cold sores).  10/06/12  Yes Harrington Challenger, NP   Physical Exam: Filed Vitals:   04/27/13 1645 04/27/13 1700 04/27/13 1715 04/27/13 1730  BP: 142/76 148/85 147/83 154/86  Pulse: 93 108 104 103  Temp:      TempSrc:      Resp: 17 23 23 22   SpO2: 97% 100% 100% 100%     General:  Thin CF, no acute distress, obvious tremulousness/tremor  Eyes:  Pupils mildly dilated but ERRL, anicteric, non-injected.  ENT:  Nares clear.  OP clear, non-erythematous without plaques or exudates.  MMM.  + tongue fasciculations.  Lips both have small contusions  Neck:  Supple without TM or JVD.    Lymph:  No cervical, supraclavicular, or submandibular LAD.  Cardiovascular:  RRR, normal S1, S2, without m/r/g.  2+ pulses, warm extremities  Respiratory:  CTA bilaterally without increased WOB.  Abdomen:  NABS.  Soft, ND/NT.    Skin:  No rashes or focal lesions.  Musculoskeletal:  Normal bulk and tone.  No LE edema.  Psychiatric:  A & O x 4.  Appropriate affect.  Neurologic:  CN 3-12 intact.  5/5 strength.  Sensation intact.  +tremor  Labs on Admission:  Basic Metabolic Panel:  Recent Labs Lab 04/27/13 1535  NA 136  K 3.6  CL 99  CO2 18*  GLUCOSE 150*  BUN 10  CREATININE 0.72  CALCIUM 9.8   Liver Function Tests: No results found for this basename: AST, ALT, ALKPHOS, BILITOT, PROT, ALBUMIN,  in the last 168 hours No results found for this basename: LIPASE, AMYLASE,  in the last 168 hours No results found for this basename: AMMONIA,  in the last 168 hours CBC:  Recent Labs Lab 04/27/13 1535  WBC 9.9  HGB 12.3  HCT 34.8*  MCV 102.7*  PLT 207   Cardiac  Enzymes: No results found for this basename: CKTOTAL, CKMB, CKMBINDEX, TROPONINI,  in the last 168 hours  BNP (last 3 results) No results found for this basename: PROBNP,  in the last 8760 hours CBG:  Recent Labs Lab 04/27/13 1519 04/27/13 1741  GLUCAP 157* 92    Radiological Exams on Admission: No results found.   Assessment/Plan Principal Problem:   Alcohol withdrawal Active Problems:   Generalized convulsive epilepsy without mention of intractable epilepsy   Anxiety and depression   Metabolic acidosis, increased anion gap  Hyperglycemia   Macrocytosis without anemia  ---  Alcohol withdrawal with possible associated seizure -  Admit to stepdown -  CIWA q2h with sliding scale ativan  -  MVI, thiamine, folate -  May need escalation to ICU as this is only day 1 -  Patient seems interested in quitting alcohol and would like resources in the area to continue abstinence  Seizure in setting of epilepsy and alcohol withdrawal -  Spoke with Dr. Thad Ranger from Neurology who recommended keeping her lamictal dose at 200mg  daily and using ativan as needed to control her seizures -  If she is unable to tolerate PO medications, will start IV vimpat -  If intractable seizures, load with vimpat 400mg  IV and call Neurology  Depression and anxiety, uncontrolled.  - Continue prozac - Will need psychiatry follow up for adjunctive therapy or transition to different SSRI  Metabolic acidosis with anion gap likely transient lactic acidosis from seizure and will resolve spontaneously -  Repeat BMP in AM  Hyperglycemia also likely secondary to stress from seizures -  Repeat BMP in AM  Macrocytosis is likely secondary to alcohol use, although she may also be folate deficient -  Add on TSH level -  Empiric folate  HLD - primary care doctor managing  Diet:  Regular Access:  PIV IVF:  Banana bag x 1 following by NS Proph:  lovenox  Code Status: full code Family Communication: spoke  to patient alone.  Please do not release medical information to boyfriend or family.   Disposition Plan: Admit to stepdown  Time spent: 60 min Kendahl Bumgardner [facility] Pager [phone]  If 7PM-7AM, please contact night-coverage [email or web address] Password [email or web address] 04/27/2013, 6:00 PM

## 2013-04-27 NOTE — ED Provider Notes (Signed)
CSN: [id number]     Arrival date & time 04/27/13  1436 History   First MD Initiated Contact with Patient 04/27/13 1513     Chief Complaint  Patient presents with  . Seizures   (Consider location/radiation/quality/duration/timing/severity/associated sxs/prior Treatment) Patient is a 47 y.o. female presenting with seizures. The history is provided by the patient.  Seizures Seizure activity on arrival: no   Seizure type:  Grand mal Preceding symptoms: aura and nausea   Initial focality:  None Episode characteristics: generalized shaking and unresponsiveness   Episode characteristics: no incontinence and no tongue biting   Postictal symptoms: confusion   Return to baseline: yes   Severity:  Moderate Timing:  Once Number of seizures this episode:  2 Progression:  Resolved Context: alcohol withdrawal   Context: not change in medication, not fever and not previous head injury   Recent head injury:  No recent head injuries PTA treatment:  None History of seizures: yes   pt drinks 3-4 glasses of wine/night and also consumes vodka--no etoh x 24 hours. Admits to tremors and shakes--no other drug use, no trauma-also, has h/o seziures and is on lamictal  Past Medical History  Diagnosis Date  . Epilepsy    History reviewed. No pertinent past surgical history. Family History  Problem Relation Age of Onset  . Heart disease Mother   . Epilepsy Sister   . Epilepsy Brother    History  Substance Use Topics  . Smoking status: Never Smoker   . Smokeless tobacco: Never Used  . Alcohol Use: 4.2 oz/week    7 Cans of beer per week   OB History   Grav Para Term Preterm Abortions TAB SAB Ect Mult Living   0              Review of Systems  Neurological: Positive for seizures.  All other systems reviewed and are negative.    Allergies  Review of patient's allergies indicates no known allergies.  Home Medications   Current Outpatient Rx  Name  Route  Sig  Dispense  Refill  .  acetaminophen (TYLENOL) 325 MG tablet   Oral   Take 650 mg by mouth every 6 (six) hours as needed for pain.         Marland Kitchen FLUoxetine (PROZAC) 20 MG capsule   Oral   Take 3 capsules (60 mg total) by mouth daily.   90 capsule   11   . LamoTRIgine 200 MG TB24   Oral   Take 1 tablet (200 mg total) by mouth daily.   90 tablet   3     Patient currently has 100mg  tabs.  She has been in ...   . norethindrone-ethinyl estradiol (MICROGESTIN,JUNEL,LOESTRIN) 1-20 MG-MCG tablet   Oral   Take 1 tablet by mouth daily.   1 Package   12   . pramoxine-hydrocortisone (ANALPRAM-HC) 1-1 % rectal cream   Rectal   Place rectally 2 (two) times daily.   30 g   1   . valACYclovir (VALTREX) 500 MG tablet   Oral   Take 500 mg by mouth as needed (cold sores).           BP 140/78  Pulse 91  Temp(Src) 97.7 F (36.5 C) (Oral)  Resp 20  SpO2 100%  LMP 02/25/2013 Physical Exam  Nursing note and vitals reviewed. Constitutional: She is oriented to person, place, and time. She appears well-developed and well-nourished.  Non-toxic appearance. No distress.  HENT:  Head: Normocephalic and atraumatic.  Eyes: Conjunctivae, EOM and lids are normal. Pupils are equal, round, and reactive to light.  Neck: Normal range of motion. Neck supple. No tracheal deviation present. No mass present.  Cardiovascular: Normal rate, regular rhythm and normal heart sounds.  Exam reveals no gallop.   No murmur heard. Pulmonary/Chest: Effort normal and breath sounds normal. No stridor. No respiratory distress. She has no decreased breath sounds. She has no wheezes. She has no rhonchi. She has no rales.  Abdominal: Soft. Normal appearance and bowel sounds are normal. She exhibits no distension. There is no tenderness. There is no rebound and no CVA tenderness.  Musculoskeletal: Normal range of motion. She exhibits no edema and no tenderness.  Neurological: She is alert and oriented to person, place, and time. She has normal  strength. She displays tremor. No cranial nerve deficit or sensory deficit. GCS eye subscore is 4. GCS verbal subscore is 5. GCS motor subscore is 6.  Skin: Skin is warm and dry. No abrasion and no rash noted.  Psychiatric: Her speech is normal. Her mood appears anxious. She is agitated.    ED Course  Procedures (including critical care time) Labs Review Labs Reviewed  GLUCOSE, CAPILLARY - Abnormal; Notable for the following:    Glucose-Capillary 157 (*)    All other components within normal limits  CBC  BASIC METABOLIC PANEL  ETHANOL  URINE RAPID DRUG SCREEN (HOSP PERFORMED)   Imaging Review No results found.  EKG Interpretation   None       MDM  No diagnosis found. Patient very tremulous here and was given Ativan 2 mg IV push. No seizure activity here. Suspect that she is going to alcohol withdrawal possible early DTs. Will be admitted to [facility] to [facility] down    Toy Baker, MD 04/27/13 1710

## 2013-04-27 NOTE — ED Notes (Signed)
Per EMS pt has hx of seizures but normally only has then one every year or so. Pt at work with witnessed 30 second seizure and helped to floor. EMS report smell of ETOH and as well as coworkers. Pt denies ETOH.  Per coworkers pt has had two seizures since September. Pt given 4 mg of Zofran by EMS d/t nausea. Pt has tremor. A/O x3.

## 2013-04-27 NOTE — ED Notes (Signed)
Per pt's significant other pt has not been eating or sleeping well.

## 2013-04-28 DIAGNOSIS — E876 Hypokalemia: Secondary | ICD-10-CM

## 2013-04-28 LAB — BASIC METABOLIC PANEL
BUN: 8 mg/dL (ref 6–23)
CO2: 24 mEq/L (ref 19–32)
Chloride: 101 mEq/L (ref 96–112)
Creatinine, Ser: 0.79 mg/dL (ref 0.50–1.10)
GFR calc Af Amer: >90 mL/min (ref >90)
Glucose, Bld: 83 mg/dL (ref 70–99)
Sodium: 135 mEq/L (ref 135–145)

## 2013-04-28 LAB — TSH: TSH: 1.644 u[IU]/mL (ref 0.350–4.500)

## 2013-04-28 LAB — MAGNESIUM: Magnesium: 2 mg/dL (ref 1.5–2.5)

## 2013-04-28 LAB — VITAMIN B12: Vitamin B-12: 780 pg/mL (ref 211–911)

## 2013-04-28 MED ORDER — LORAZEPAM 2 MG/ML IJ SOLN
0.0000 mg | INTRAMUSCULAR | Status: DC
  Administered 2013-04-29: 4 mg via INTRAVENOUS
  Filled 2013-04-28: qty 2

## 2013-04-28 MED ORDER — ENOXAPARIN SODIUM 40 MG/0.4ML ~~LOC~~ SOLN
40.0000 mg | SUBCUTANEOUS | Status: DC
  Administered 2013-04-28 – 2013-04-29 (×2): 40 mg via SUBCUTANEOUS
  Filled 2013-04-28 (×3): qty 0.4

## 2013-04-28 MED ORDER — LORAZEPAM 1 MG PO TABS
0.0000 mg | ORAL_TABLET | ORAL | Status: DC
  Administered 2013-04-29: 2 mg via ORAL
  Filled 2013-04-28: qty 2

## 2013-04-28 MED ORDER — POTASSIUM CHLORIDE CRYS ER 20 MEQ PO TBCR
40.0000 meq | EXTENDED_RELEASE_TABLET | Freq: Once | ORAL | Status: AC
  Administered 2013-04-28: 40 meq via ORAL
  Filled 2013-04-28: qty 2

## 2013-04-28 NOTE — Progress Notes (Signed)
[facility] PROGRESS NOTE  Robin Henderson QMV:[id number] DOB: 24-Oct-1965 DOA: 04/27/2013 PCP: No primary provider on file.  Assessment/Plan  Alcohol withdrawal with possible associated seizure.  CIWA scores 0-8 since initial 12 at admission.  Has received 11mg  of ativan  - Change to CIWA q4h with sliding scale ativan  - MVI, thiamine, folate  - Anticipate that she may transfer to telemetry - Patient seems interested in quitting alcohol and would like resources in the area to continue abstinence   Seizure in setting of epilepsy and alcohol withdrawal, no further seizures.   - Continue lamictal dose at 200mg  daily  - Continue ativan prn seizures - If intractable seizures, load with vimpat 400mg  IV and call Neurology   Depression and anxiety, uncontrolled.   - Continue prozac  - Will need psychiatry follow up for adjunctive therapy or transition to different SSRI   Metabolic acidosis with anion gap likely transient lactic acidosis from seizure and resolved spontaneously  Hyperglycemia also likely secondary to stress from seizures, resolved.   Macrocytosis is likely secondary to alcohol use, although she may also be folate deficient  - TSH and vitamin B12 wnl - Empiric folate   HLD - primary care doctor managing   Hypokalemia, likely due to EtOH abuse.  Given oral repletion KCl.    Diet: Regular  Access: PIV  IVF: yes Proph: lovenox   Code Status: full code  Family Communication: spoke to patient alone. Please do not release medical information to boyfriend or family.  Disposition Plan:  Anticipate transfer to telemetry tomorrow and discharge soon   Consultants:  none  Procedures:  none  Antibiotics:  none   HPI/Subjective:  Patient states that she is feeling much better today.  She denies seizures and has some mild anxiety and tremor, but no vomiting or diarrhea.  She ate some dinner last night.    Objective: Filed Vitals:   04/28/13 0800 04/28/13 0900  04/28/13 1000 04/28/13 1200  BP: 131/83  146/85 126/64  Pulse: 73 89 70 39  Temp: 98.5 F (36.9 C)   98.2 F (36.8 C)  TempSrc: Oral   Oral  Resp:  16  17  Height:      Weight:      SpO2:  100%  97%    Intake/Output Summary (Last 24 hours) at 04/28/13 1619 Last data filed at 04/28/13 1300  Gross per 24 hour  Intake    900 ml  Output    350 ml  Net    550 ml   Filed Weights   04/27/13 1815 04/28/13 0400  Weight: 56 kg (123 lb 7.3 oz) 56.1 kg (123 lb 10.9 oz)    Exam:   General:  CF, No acute distress, pleasant and conversant.  Able to concentrate   HEENT:  NCAT, MMM, mild tongue fasciculations  Cardiovascular:  RRR, nl S1, S2 no mrg, 2+ pulses, warm extremities  Respiratory:  CTAB, no increased WOB  Abdomen:   NABS, soft, NT/ND  MSK:   Normal tone and bulk, no LEE  Neuro:  Grossly intact, mild tremor  Data Reviewed: Basic Metabolic Panel:  Recent Labs Lab 04/27/13 1535 04/28/13 0324  NA 136 135  K 3.6 3.1*  CL 99 101  CO2 18* 24  GLUCOSE 150* 83  BUN 10 8  CREATININE 0.72 0.79  CALCIUM 9.8 8.8  MG  --  2.0   Liver Function Tests: No results found for this basename: AST, ALT, ALKPHOS, BILITOT, PROT, ALBUMIN,  in  the last 168 hours No results found for this basename: LIPASE, AMYLASE,  in the last 168 hours No results found for this basename: AMMONIA,  in the last 168 hours CBC:  Recent Labs Lab 04/27/13 1535  WBC 9.9  HGB 12.3  HCT 34.8*  MCV 102.7*  PLT 207   Cardiac Enzymes: No results found for this basename: CKTOTAL, CKMB, CKMBINDEX, TROPONINI,  in the last 168 hours BNP (last 3 results) No results found for this basename: PROBNP,  in the last 8760 hours CBG:  Recent Labs Lab 04/27/13 1519 04/27/13 1741  GLUCAP 157* 92    Recent Results (from the past 240 hour(s))  MRSA PCR SCREENING     Status: None   Collection Time    04/27/13  6:32 PM      Result Value Range Status   MRSA by PCR NEGATIVE  NEGATIVE Final   Comment:             The GeneXpert MRSA Assay (FDA     approved for NASAL specimens     only), is one component of a     comprehensive MRSA colonization     surveillance program. It is not     intended to diagnose MRSA     infection nor to guide or     monitor treatment for     MRSA infections.     Studies: No results found.  Scheduled Meds: . enoxaparin (LOVENOX) injection  40 mg Subcutaneous Q24H  . FLUoxetine  60 mg Oral Daily  . folic acid  1 mg Oral Daily  . lamoTRIgine  200 mg Oral QHS  . LORazepam  0-8 mg Intravenous Q2H  . LORazepam  0-8 mg Oral Q2H  . multivitamin with minerals  1 tablet Oral Daily  . norethindrone-ethinyl estradiol  1 tablet Oral QHS  . sodium chloride  3 mL Intravenous Q12H  . thiamine  100 mg Oral Daily   Or  . thiamine  100 mg Intravenous Daily   Continuous Infusions: . dextrose 5 % and 0.45 % NaCl with KCl 10 mEq/L 75 mL/hr at 04/28/13 1610    Principal Problem:   Alcohol withdrawal Active Problems:   Generalized convulsive epilepsy without mention of intractable epilepsy   Anxiety and depression   Metabolic acidosis, increased anion gap   Hyperglycemia   Macrocytosis without anemia    Time spent: 30 min    Jeury Mcnab, [facility]  [facility] Pager [phone]. If 7PM-7AM, please contact night-coverage at [email or web address], password [facility] 04/28/2013, 4:19 PM  LOS: 1 day

## 2013-04-28 NOTE — Progress Notes (Signed)
ANTICOAGULATION CONSULT NOTE - Initial Consult  Pharmacy Consult for Lovenox Indication: VTE prophylaxis  No Known Allergies  Patient Measurements: Height: 5\' 4"  (162.6 cm) Weight: 123 lb 10.9 oz (56.1 kg) IBW/kg (Calculated) : 54.7 Heparin Dosing Weight:   Vital Signs: Temp: 98.2 F (36.8 C) (10/24 1200) Temp src: Oral (10/24 1200) BP: 126/64 mmHg (10/24 1200) Pulse Rate: 39 (10/24 1200)  Labs:  Recent Labs  04/27/13 1535 04/28/13 0324  HGB 12.3  --   HCT 34.8*  --   PLT 207  --   CREATININE 0.72 0.79    Estimated Creatinine Clearance: 76.7 ml/min (by C-G formula based on Cr of 0.79).   Medical History: Past Medical History  Diagnosis Date  . 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 Anxiety and depression   . Suicide attempt     x 2.  Almost slit wrists and almost jumped off a parking deck  . Hemorrhoids   . Alcohol abuse     Medications:  Scheduled:  . enoxaparin (LOVENOX) injection  40 mg Subcutaneous Q24H  . FLUoxetine  60 mg Oral Daily  . folic acid  1 mg Oral Daily  . lamoTRIgine  200 mg Oral QHS  . LORazepam  0-8 mg Intravenous Q4H  . LORazepam  0-8 mg Oral Q4H  . multivitamin with minerals  1 tablet Oral Daily  . norethindrone-ethinyl estradiol  1 tablet Oral QHS  . sodium chloride  3 mL Intravenous Q12H  . thiamine  100 mg Oral Daily   Or  . thiamine  100 mg Intravenous Daily   Infusions:  . dextrose 5 % and 0.45 % NaCl with KCl 10 mEq/L 75 mL/hr at 04/28/13 0858   PRN: acetaminophen, acetaminophen, hydrocortisone-pramoxine, LORazepam, polyethylene glycol  Assessment: 47 yo F with alcohol withdrawal and seizures. Goal of Therapy:  Anti-Xa level 0.3-0.6 units/ml 4 hours after LMWH dose given Monitor platelets by anticoagulation protocol: Yes   Plan:  Lovenox 40 mg sq q 24 hours. Begin at 8pm  Loletta Specter 04/28/2013,5:09 PM

## 2013-04-28 NOTE — Progress Notes (Signed)
CARE MANAGEMENT NOTE 04/28/2013  Patient:  GENIYA, FULGHAM   Account Number:  [id number]  Date Initiated:  04/28/2013  Documentation initiated by:  DAVIS,RHONDA  Subjective/Objective Assessment:   pt with hx of etoh abuse presented after having seizure activity.     Action/Plan:   home when stable   Anticipated DC Date:  05/01/2013   Anticipated DC Plan:  HOME/SELF CARE  In-house referral  Clinical Social Worker      DC Planning Services  NA      [facility] Choice  NA   Choice offered to / List presented to:  NA   DME arranged  NA      DME agency  NA     HH arranged  NA      HH agency  NA   Status of service:  In process, will continue to follow Medicare Important Message given?  NA - LOS <3 / Initial given by admissions (If response is "NO", the following Medicare IM given date fields will be blank) Date Medicare IM given:   Date Additional Medicare IM given:    Discharge Disposition:    Per UR Regulation:  Reviewed for med. necessity/level of care/duration of stay  If discussed at Long Length of Stay Meetings, dates discussed:    Comments:  [id number]/Rhonda Stark Jock, BSN, [place] [phone] Chart Reviewed for discharge and hospital needs. Discharge needs at time of review:  None Review of patient progress due on [id number].

## 2013-04-29 DIAGNOSIS — F329 Major depressive disorder, single episode, unspecified: Secondary | ICD-10-CM

## 2013-04-29 DIAGNOSIS — G40909 Epilepsy, unspecified, not intractable, without status epilepticus: Secondary | ICD-10-CM

## 2013-04-29 MED ORDER — LORAZEPAM 1 MG PO TABS: 0.0000 mg | ORAL_TABLET | ORAL | Status: DC

## 2013-04-29 MED ORDER — LORAZEPAM 2 MG/ML IJ SOLN
0.0000 mg | INTRAMUSCULAR | Status: DC
  Administered 2013-04-29: 4 mg via INTRAVENOUS
  Filled 2013-04-29: qty 1

## 2013-04-29 MED ORDER — KCL IN DEXTROSE-NACL 20-5-0.45 MEQ/L-%-% IV SOLN
INTRAVENOUS | Status: DC
  Administered 2013-04-29 – 2013-04-30 (×2): via INTRAVENOUS
  Filled 2013-04-29 (×3): qty 1000

## 2013-04-29 NOTE — Progress Notes (Signed)
[facility] PROGRESS NOTE  Robin Henderson BMW:[id number] DOB: [birth year] DOA: 04/27/2013 PCP: No primary provider on file.  Assessment/Plan  Alcohol withdrawal with possible associated seizure.  CIWA scores rising dramatically today.  Clearly worsening alcohol withdrawal. - Sitter to bedside - Will initiate involuntary commitment paperwork - Increase CIWA to q2h again with sliding scale ativan  - MVI, thiamine, folate  - Will need case management and social work when better  Seizure in setting of epilepsy and alcohol withdrawal, no further seizures.   - Continue lamictal dose at 200mg  daily  - Continue ativan prn seizures - If intractable seizures, load with vimpat 400mg  IV and call Neurology   Depression and anxiety, uncontrolled.   - Continue prozac  - Will need psychiatry follow up for adjunctive therapy or transition to different SSRI   Metabolic acidosis with anion gap likely transient lactic acidosis from seizure and resolved spontaneously  Hyperglycemia also likely secondary to stress from seizures, resolved.   Macrocytosis is likely secondary to alcohol use, although she may also be folate deficient  - TSH and vitamin B12 wnl - Empiric folate   HLD - primary care doctor managing   Hypokalemia, likely due to EtOH abuse.  Given oral repletion KCl.    Diet: Regular  Access: PIV  IVF: yes Proph: lovenox   Code Status: full code  Family Communication: spoke to patient alone. Please do not release medical information to boyfriend or family.  Disposition Plan:  Continue inpatient monitoring.     Consultants:  none  Procedures:  none  Antibiotics:  none   HPI/Subjective:  Interview this morning was calm and patient was doing well.  Last several CIWA scores were 0.  An hour after my initial interview her score went up to 8 and then to 15 by the time she transferred out of the stepdown until.  Upon second interview at 11:45AM:  Patient unclear about  date, pulling out IV, and stating she needs to leave the hospital.  She tries to negotiate by saying her mother can take care of her but she is not understanding that her mother cannot monitor her and administer appropriate medications to prevent alcohol withdrawal related symptoms, including alcohol withdrawal seizure.    Objective: Filed Vitals:   04/29/13 0700 04/29/13 0800 04/29/13 1000 04/29/13 1100  BP: 146/81  153/73 149/97  Pulse: 74  82 102  Temp:  98.3 F (36.8 C) 98.2 F (36.8 C)   TempSrc:  Oral Oral   Resp: 12  20   Height:   5\' 4"  (1.626 m)   Weight:   55.702 kg (122 lb 12.8 oz)   SpO2: 97%  100%     Intake/Output Summary (Last 24 hours) at 04/29/13 1149 Last data filed at 04/29/13 1000  Gross per 24 hour  Intake   2130 ml  Output    950 ml  Net   1180 ml   Filed Weights   04/28/13 0400 04/29/13 0400 04/29/13 1000  Weight: 56.1 kg (123 lb 10.9 oz) 55.8 kg (123 lb 0.3 oz) 55.702 kg (122 lb 12.8 oz)    Exam:   General:  CF, No acute distress, pleasant and conversant earlier today, currently very agitated and combative, pulling out IV, unable to concentrate.    HEENT:  NCAT, MMM, prominent tongue fasciculations  Cardiovascular:  Tachycardic, regular rhythm, nl S1, S2 no mrg, 2+ pulses, warm extremities  Respiratory:  CTAB, no increased WOB  Abdomen:   NABS, soft, NT/ND  MSK:  Normal tone and bulk, no LEE  Neuro:  Grossly intact, full body tremor.  Data Reviewed: Basic Metabolic Panel:  Recent Labs Lab 04/27/13 1535 04/28/13 0324  NA 136 135  K 3.6 3.1*  CL 99 101  CO2 18* 24  GLUCOSE 150* 83  BUN 10 8  CREATININE 0.72 0.79  CALCIUM 9.8 8.8  MG  --  2.0   Liver Function Tests: No results found for this basename: AST, ALT, ALKPHOS, BILITOT, PROT, ALBUMIN,  in the last 168 hours No results found for this basename: LIPASE, AMYLASE,  in the last 168 hours No results found for this basename: AMMONIA,  in the last 168 hours CBC:  Recent  Labs Lab 04/27/13 1535  WBC 9.9  HGB 12.3  HCT 34.8*  MCV 102.7*  PLT 207   Cardiac Enzymes: No results found for this basename: CKTOTAL, CKMB, CKMBINDEX, TROPONINI,  in the last 168 hours BNP (last 3 results) No results found for this basename: PROBNP,  in the last 8760 hours CBG:  Recent Labs Lab 04/27/13 1519 04/27/13 1741  GLUCAP 157* 92    Recent Results (from the past 240 hour(s))  MRSA PCR SCREENING     Status: None   Collection Time    04/27/13  6:32 PM      Result Value Range Status   MRSA by PCR NEGATIVE  NEGATIVE Final   Comment:            The GeneXpert MRSA Assay (FDA     approved for NASAL specimens     only), is one component of a     comprehensive MRSA colonization     surveillance program. It is not     intended to diagnose MRSA     infection nor to guide or     monitor treatment for     MRSA infections.     Studies: No results found.  Scheduled Meds: . enoxaparin (LOVENOX) injection  40 mg Subcutaneous Q24H  . FLUoxetine  60 mg Oral Daily  . folic acid  1 mg Oral Daily  . lamoTRIgine  200 mg Oral QHS  . LORazepam  0-8 mg Intravenous Q4H  . LORazepam  0-8 mg Oral Q4H  . multivitamin with minerals  1 tablet Oral Daily  . norethindrone-ethinyl estradiol  1 tablet Oral QHS  . sodium chloride  3 mL Intravenous Q12H  . thiamine  100 mg Oral Daily   Or  . thiamine  100 mg Intravenous Daily   Continuous Infusions:    Principal Problem:   Alcohol withdrawal Active Problems:   Generalized convulsive epilepsy without mention of intractable epilepsy   Anxiety and depression   Metabolic acidosis, increased anion gap   Hyperglycemia   Macrocytosis without anemia   Hypokalemia    Time spent: 30 min    Robin Henderson, [facility]  [facility] Pager [phone]. If 7PM-7AM, please contact night-coverage at [email or web address], password [facility] 04/29/2013, 11:49 AM  LOS: 2 days

## 2013-04-29 NOTE — Progress Notes (Signed)
Robin Henderson 1238 TRANSFERRED TO 1515- REPORT GIVEN TO SHON RN. NO CHANGE IN PT'S CONDITION.

## 2013-04-29 NOTE — Progress Notes (Signed)
Patient doing well.  CIWA scores trending down and required only 6mg  of ativan yesterday.  Family aware of condition and needs to see case management or social work regarding community resources.  Transfer to med-surg, continue CIWA for now, and plan to d/c later today with ativan taper.

## 2013-04-29 NOTE — Progress Notes (Addendum)
Asked by MD to initiate IVC papers.  Completed IVC papers.  Notified Magistrate of impending fax.  Confirmed with Magistrate receipt of fax.  Magistrate denied IVC.   Revised IVC.  Magistrate to review.  Providence Crosby, LCSWA Clinical Social Work [phone]

## 2013-04-30 MED ORDER — FOLIC ACID 1 MG PO TABS: 1.0000 mg | ORAL_TABLET | Freq: Every day | ORAL | Status: DC

## 2013-04-30 MED ORDER — ADULT MULTIVITAMIN W/MINERALS CH: 1.0000 | ORAL_TABLET | Freq: Every day | ORAL | Status: DC

## 2013-04-30 MED ORDER — LORAZEPAM 1 MG PO TABS: ORAL_TABLET | ORAL | Status: DC

## 2013-04-30 MED ORDER — THIAMINE HCL 100 MG PO TABS: 100.0000 mg | ORAL_TABLET | Freq: Every day | ORAL | Status: DC

## 2013-04-30 MED ORDER — LORAZEPAM 1 MG PO TABS: 2.0000 mg | ORAL_TABLET | Freq: Two times a day (BID) | ORAL | Status: DC

## 2013-04-30 NOTE — Progress Notes (Signed)
Discharge instructions explained to pt and her mother. Prescriptions given to pt. Pt states understanding instructions. IV dc'd.

## 2013-04-30 NOTE — Plan of Care (Signed)
Problem: Discharge Progression Outcomes Goal: Complications resolved/controlled Pt spoke with case Production designer, theatre/television/film and Child psychotherapist. Mother in the room. Information given to pt about AA.

## 2013-04-30 NOTE — Discharge Summary (Signed)
Physician Discharge Summary  Robin Henderson ZOX:[id number] DOB: 1966/07/06 DOA: 04/27/2013  PCP: No primary provider on file.  Admit date: 04/27/2013 Discharge date: 04/30/2013  Recommendations for Outpatient Follow-up:  1. Outpatient AA and other alcohol abstinence program information provided to patient 2. Follow up with neurology in 1 month for reevaluation given month seizures 3. Primary care doctor within 2 weeks 4. Behavioral health or psychiatry within one week  Discharge Diagnoses:  Principal Problem:   Alcohol withdrawal Active Problems:   Generalized convulsive epilepsy without mention of intractable epilepsy   Anxiety and depression   Metabolic acidosis, increased anion gap   Hyperglycemia   Macrocytosis without anemia   Hypokalemia   Discharge Condition: stable, improved  Diet recommendation:  regular  Wt Readings from Last 3 Encounters:  04/29/13 55.702 kg (122 lb 12.8 oz)  11/21/12 60.782 kg (134 lb)  10/06/12 63.504 kg (140 lb)    History of present illness:   The patient is a 47 y.o. year-old female with history of grand mal seizures, depression and anxiety with two previous suicide attempts, alcohol dependence who presents with seizures and alcohol withdrawal. The patient was last at their baseline health a few days ago.   Seizures: Seizures were diagnosed about 5-6 years ago and are grand mal seizures that last about 45 to 60 seconds and are associated with tongue biting and urinary incontinence. She has been on lamictal but having seizures about every month for the last few months so her dose was increased to 200mg  daily. Today, she was at work and had a 3-second witnessed seizure. She was assisted to the floor and did not fall or hit her head. She denies recent head trauma or falls. EMS transported her to the emergency department.   EtOH: For the last 12 years, she has had alcohol daily and she is currently drinking more than she ever has. She drinks 4-5  large glasses of wine daily and about a quarter of a fifth of vodka per day. She decided to quit alcohol altogether because she is making some life changes and her last drink was last night. Her alcohol level in the ER was < 11 but she was experiencing shakes, tremors, tongue fasciculations, and hypertension. She received 3 mg of IV ativan in the ER with some relief.   Labs were notable for CO2 of 18, glucose of 150, but were otherwise normal. UDS was negative.   Hospital Course:   Alcohol withdrawal with possible associated seizure.  She was started on CIWA protocol and required decreasing doses of ativan.  She became delirious during her stay, disoriented, pulling out IV, agitated and demanding to leave, however, she was too delirious to understand the risk of death due to seizure and alcohol withdrawal so she was involuntarily committed.  Her alcohol scores trended down and her mother agreed to assume care for her post-discharge.  She will be given a decreasing ativan taper to use for the next couple of days, then stop.  She was also given prescriptions for thiamine, folate, and MVI.    Seizure in setting of epilepsy and alcohol withdrawal, no further seizures.  Continued lamictal dose at 200mg  daily at the recommendation of Dr. Thad Ranger, Neurology, who consulted by phone.    Depression and anxiety, uncontrolled.  Continued prozac.  Will need psychiatry follow up for adjunctive therapy or transition to different SSRI.  She was given information about behavioral health.    Metabolic acidosis with anion gap likely transient lactic acidosis from  seizure and resolved spontaneously.  Hyperglycemia also likely secondary to stress from seizures, resolved.   Macrocytosis is likely secondary to alcohol use, although she may also be folate deficient.  TSH and vitamin B12 wnl.  Empiric folate.    HLD - primary care doctor managing   Hypokalemia, likely due to EtOH abuse. Given oral repletion KCl.     Consultants:  none Procedures:  none Antibiotics:  none    Discharge Exam: Filed Vitals:   04/30/13 0957  BP: 138/89  Pulse: 91  Temp:   Resp:    Filed Vitals:   04/30/13 0400 04/30/13 0600 04/30/13 0800 04/30/13 0957  BP: 143/91 149/89 138/89 138/89  Pulse: 88 80 82 91  Temp:  98 F (36.7 C)    TempSrc:  Oral    Resp:  20    Height:      Weight:      SpO2: 96% 100%      General: CF, No acute distress, pleasant HEENT: NCAT, MMM Cardiovascular: RRR nl S1, S2 no mrg, 2+ pulses, warm extremities  Respiratory: CTAB, no increased WOB  Abdomen: NABS, soft, NT/ND  MSK: Normal tone and bulk, no LEE  Neuro: Grossly intact, faint tremor.   Discharge Instructions      Discharge Orders   Future Appointments Provider Department Dept Phone   05/24/2013 2:30 PM Melvyn Novas, MD [facility] [phone]   Future Orders Complete By Expires   Call MD for:  difficulty breathing, headache or visual disturbances  As directed    Call MD for:  extreme fatigue  As directed    Call MD for:  hives  As directed    Call MD for:  persistant dizziness or light-headedness  As directed    Call MD for:  persistant nausea and vomiting  As directed    Call MD for:  severe uncontrolled pain  As directed    Call MD for:  temperature >100.4  As directed    Diet general  As directed    Discharge instructions  As directed    Comments:     You were hospitalized with alcohol withdrawal and a seizure.  You were treated for alcohol withdrawal with ativan.  Please take 2 tabs tonight before bed, 1 tab in the morning and again at night tomorrow, then 1 tab daily for the next two days, then stop.  You may return to work on Tuesday, no restrictions.  Please take thiamine and folate until directed to stop by your primary care doctor.  Follow up within one week at behavioral health or your psychiatrist for further counseling and management of depression and anxiety.  Please go to AA  every day or choose another resource to help you through this process.   Driving Restrictions  As directed    Comments:     No driving, operating heavy machinery, swimming unsupervised, or babysitting until cleared by your neurologist for primary care doctor.   Increase activity slowly  As directed        Medication List         acetaminophen 325 MG tablet  Commonly known as:  TYLENOL  Take 650 mg by mouth every 6 (six) hours as needed for pain.     FLUoxetine 20 MG capsule  Commonly known as:  PROZAC  Take 3 capsules (60 mg total) by mouth daily.     folic acid 1 MG tablet  Commonly known as:  FOLVITE  Take 1 tablet (1 mg  total) by mouth daily.     lamoTRIgine 200 MG tablet  Commonly known as:  LAMICTAL  Take 200 mg by mouth at bedtime.     LORazepam 1 MG tablet  Commonly known as:  ATIVAN  Take 2 tabs tonight, then 1 tab twice daily tomorrow, then 1 tab once daily for two days, then stop.     multivitamin with minerals Tabs tablet  Take 1 tablet by mouth daily.     norethindrone-ethinyl estradiol 1-20 MG-MCG tablet  Commonly known as:  MICROGESTIN,JUNEL,LOESTRIN  Take 1 tablet by mouth daily.     pramoxine-hydrocortisone 1-1 % rectal cream  Commonly known as:  ANALPRAM-HC  Place rectally 2 (two) times daily.     thiamine 100 MG tablet  Take 1 tablet (100 mg total) by mouth daily.     valACYclovir 500 MG tablet  Commonly known as:  VALTREX  Take 500 mg by mouth as needed (cold sores).       Follow-up Information   Follow up with [facility] In 1 week. (no appointment necessary)    Specialty:  Behavioral Health   Contact information:   [address] [place] [address] [phone]      Follow up with Primary care doctor. Schedule an appointment as soon as possible for a visit in 2 weeks.      The results of significant diagnostics from this hospitalization (including imaging, microbiology, ancillary and  laboratory) are listed below for reference.    Significant Diagnostic Studies: No results found.  Microbiology: Recent Results (from the past 240 hour(s))  MRSA PCR SCREENING     Status: None   Collection Time    04/27/13  6:32 PM      Result Value Range Status   MRSA by PCR NEGATIVE  NEGATIVE Final   Comment:            The GeneXpert MRSA Assay (FDA     approved for NASAL specimens     only), is one component of a     comprehensive MRSA colonization     surveillance program. It is not     intended to diagnose MRSA     infection nor to guide or     monitor treatment for     MRSA infections.     Labs: Basic Metabolic Panel:  Recent Labs Lab 04/27/13 1535 04/28/13 0324  NA 136 135  K 3.6 3.1*  CL 99 101  CO2 18* 24  GLUCOSE 150* 83  BUN 10 8  CREATININE 0.72 0.79  CALCIUM 9.8 8.8  MG  --  2.0   Liver Function Tests: No results found for this basename: AST, ALT, ALKPHOS, BILITOT, PROT, ALBUMIN,  in the last 168 hours No results found for this basename: LIPASE, AMYLASE,  in the last 168 hours No results found for this basename: AMMONIA,  in the last 168 hours CBC:  Recent Labs Lab 04/27/13 1535  WBC 9.9  HGB 12.3  HCT 34.8*  MCV 102.7*  PLT 207   Cardiac Enzymes: No results found for this basename: CKTOTAL, CKMB, CKMBINDEX, TROPONINI,  in the last 168 hours BNP: BNP (last 3 results) No results found for this basename: PROBNP,  in the last 8760 hours CBG:  Recent Labs Lab 04/27/13 1519 04/27/13 1741  GLUCAP 157* 92    Time coordinating discharge: 45 minutes  Signed:  Raihana Balderrama  [facility] 04/30/2013, 11:05 AM

## 2013-04-30 NOTE — Progress Notes (Signed)
Clinical Social Work Department BRIEF PSYCHOSOCIAL ASSESSMENT 04/30/2013  Patient:  Robin Henderson, Robin Henderson     Account Number:  [id number]     Admit date:  04/27/2013  Clinical Social Worker:  Doroteo Glassman  Date/Time:  04/30/2013 10:57 AM  Referred by:  Physician  Date Referred:  04/30/2013 Referred for  Other - See comment   Other Referral:   Current SA   Interview type:  Patient Other interview type:   Pt's mom present but did not contribute to the assessment    PSYCHOSOCIAL DATA Living Status:  ALONE Admitted from facility:   Level of care:   Primary support name:  Knoch,Sheila Primary support relationship to patient:  PARENT Degree of support available:   strong    CURRENT CONCERNS Current Concerns  Post-Acute Placement   Other Concerns:    SOCIAL WORK ASSESSMENT / PLAN Asked by MD to meet with Pt now, as Pt d/c'ing and in need of SA information.    Met with Pt to discuss SA.    Pt eager to leave and asked CSW several times to take out her IV.    Pt was not interested in discussing her SA problems but was willing to receive SA tx info, as Pt stated, "I'm just ready to get out of here and go home now."    CSW provided Pt with area SA tx info, including IOP at [facility], [facility], [facility].  CSW offered to go over these resources but Pt declined, stating, "I used to work for the [employer].  I'm famililar with these places."    Pt nor her mom had any questions or concerns.    CSW thanked them for their time.    Pt to d/c now.    CSW signing off.   Assessment/plan status:  No Further Intervention Required Other assessment/ plan:   Information/referral to community resources:   Area SA tx info.    PATIENT'S/FAMILY'S RESPONSE TO PLAN OF CARE: Pt's response to her plan of care was eagerness.  Pt was eager to leave the hospital and review CSW's SA information.  She will then decide what the best course of tx is for her.    Pt thanked CSW for time and  assistance.   Providence Crosby, LCSWA Clinical Social Work [phone]

## 2013-05-06 ENCOUNTER — Emergency Department (HOSPITAL_COMMUNITY): Payer: BC Managed Care – PPO

## 2013-05-06 ENCOUNTER — Emergency Department (HOSPITAL_COMMUNITY)
Admission: EM | Admit: 2013-05-06 | Discharge: 2013-05-06 | Discharge status: Against Medical Advice | Payer: BC Managed Care – PPO | Attending: Emergency Medicine | Admitting: Emergency Medicine

## 2013-05-06 ENCOUNTER — Encounter (HOSPITAL_COMMUNITY): Payer: Self-pay | Admitting: Emergency Medicine

## 2013-05-06 DIAGNOSIS — Z79899 Other long term (current) drug therapy: Secondary | ICD-10-CM | POA: Insufficient documentation

## 2013-05-06 DIAGNOSIS — F1010 Alcohol abuse, uncomplicated: Secondary | ICD-10-CM | POA: Insufficient documentation

## 2013-05-06 DIAGNOSIS — Z8679 Personal history of other diseases of the circulatory system: Secondary | ICD-10-CM | POA: Insufficient documentation

## 2013-05-06 DIAGNOSIS — R296 Repeated falls: Secondary | ICD-10-CM | POA: Insufficient documentation

## 2013-05-06 DIAGNOSIS — IMO0002 Reserved for concepts with insufficient information to code with codable children: Secondary | ICD-10-CM | POA: Insufficient documentation

## 2013-05-06 DIAGNOSIS — Y929 Unspecified place or not applicable: Secondary | ICD-10-CM | POA: Insufficient documentation

## 2013-05-06 DIAGNOSIS — S0990XA Unspecified injury of head, initial encounter: Secondary | ICD-10-CM | POA: Insufficient documentation

## 2013-05-06 DIAGNOSIS — F411 Generalized anxiety disorder: Secondary | ICD-10-CM | POA: Insufficient documentation

## 2013-05-06 DIAGNOSIS — G40909 Epilepsy, unspecified, not intractable, without status epilepticus: Secondary | ICD-10-CM | POA: Insufficient documentation

## 2013-05-06 DIAGNOSIS — Y939 Activity, unspecified: Secondary | ICD-10-CM | POA: Insufficient documentation

## 2013-05-06 DIAGNOSIS — R569 Unspecified convulsions: Secondary | ICD-10-CM

## 2013-05-06 DIAGNOSIS — F329 Major depressive disorder, single episode, unspecified: Secondary | ICD-10-CM | POA: Insufficient documentation

## 2013-05-06 DIAGNOSIS — F3289 Other specified depressive episodes: Secondary | ICD-10-CM | POA: Insufficient documentation

## 2013-05-06 LAB — BASIC METABOLIC PANEL
BUN: 10 mg/dL (ref 6–23)
CO2: 23 mEq/L (ref 19–32)
Creatinine, Ser: 0.69 mg/dL (ref 0.50–1.10)
GFR calc Af Amer: >90 mL/min (ref >90)
GFR calc non Af Amer: >90 mL/min (ref >90)

## 2013-05-06 LAB — CBC WITH DIFFERENTIAL/PLATELET
Basophils Relative: 0 % (ref 0–1)
Eosinophils Absolute: 0 10*3/uL (ref 0.0–0.7)
Eosinophils Relative: 0 % (ref 0–5)
HCT: 36 % (ref 36.0–46.0)
Hemoglobin: 12.3 g/dL (ref 12.0–15.0)
Lymphocytes Relative: 8 % — ABNORMAL LOW (ref 12–46)
Lymphs Abs: 0.7 10*3/uL (ref 0.7–4.0)
MCH: 35.5 pg — ABNORMAL HIGH (ref 26.0–34.0)
MCHC: 34.2 g/dL (ref 30.0–36.0)
MCV: 104 fL — ABNORMAL HIGH (ref 78.0–100.0)
Monocytes Absolute: 1 10*3/uL (ref 0.1–1.0)
Monocytes Relative: 11 % (ref 3–12)
RBC: 3.46 MIL/uL — ABNORMAL LOW (ref 3.87–5.11)

## 2013-05-06 MED ORDER — ONDANSETRON HCL 4 MG PO TABS: 4.0000 mg | ORAL_TABLET | Freq: Three times a day (TID) | ORAL | Status: DC | PRN

## 2013-05-06 MED ORDER — LORAZEPAM 2 MG/ML IJ SOLN
1.0000 mg | Freq: Once | INTRAMUSCULAR | Status: AC
  Administered 2013-05-06: 1 mg via INTRAVENOUS
  Filled 2013-05-06: qty 1

## 2013-05-06 MED ORDER — IBUPROFEN 200 MG PO TABS: 600.0000 mg | ORAL_TABLET | Freq: Three times a day (TID) | ORAL | Status: DC | PRN

## 2013-05-06 MED ORDER — LORAZEPAM 1 MG PO TABS: 0.0000 mg | ORAL_TABLET | Freq: Two times a day (BID) | ORAL | Status: DC

## 2013-05-06 MED ORDER — VITAMIN B-1 100 MG PO TABS: 100.0000 mg | ORAL_TABLET | Freq: Every day | ORAL | Status: DC

## 2013-05-06 MED ORDER — THIAMINE HCL 100 MG/ML IJ SOLN: 100.0000 mg | Freq: Every day | INTRAMUSCULAR | Status: DC

## 2013-05-06 MED ORDER — LORAZEPAM 1 MG PO TABS: 0.0000 mg | ORAL_TABLET | Freq: Four times a day (QID) | ORAL | Status: DC

## 2013-05-06 MED ORDER — POTASSIUM CHLORIDE CRYS ER 20 MEQ PO TBCR
40.0000 meq | EXTENDED_RELEASE_TABLET | Freq: Once | ORAL | Status: AC
  Administered 2013-05-06: 40 meq via ORAL
  Filled 2013-05-06: qty 2

## 2013-05-06 NOTE — ED Provider Notes (Signed)
CSN: [id number]     Arrival date & time 05/06/13  1438 History   First MD Initiated Contact with Patient 05/06/13 1500     Chief Complaint  Patient presents with  . Seizures  . Fall   (Consider location/radiation/quality/duration/timing/severity/associated sxs/prior Treatment) Patient is a 47 y.o. female presenting with seizures.  Seizures Seizure activity on arrival: no   Seizure type: unclear.  "seizure" Preceding symptoms: aura (felt jittery)   Episode characteristics: unresponsiveness   Postictal symptoms: confusion, memory loss and somnolence   Return to baseline: yes   Duration:  45 seconds Timing:  Once Progression:  Resolved Context: medical non-compliance (missed lamictal dose last night)   Context comment:  Heavy alcohol use last night. Recent head injury:  During the event History of seizures: yes     Past Medical History  Diagnosis Date  . 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 Anxiety and depression   . Suicide attempt     x 2.  Almost slit wrists and almost jumped off a parking deck  . Hemorrhoids   . Alcohol abuse    Past Surgical History  Procedure Laterality Date  . External ear surgery      correct earing hole  . Head & neck skin lesion excisional biopsy      lip lesion   Family History  Problem Relation Age of Onset  . Heart disease Mother   . Epilepsy Sister   . Epilepsy Brother   . Depression Brother     with suicide attempts  . Depression Sister     possible suicide attempt  . Alcohol abuse Mother   . Alcohol abuse Maternal Grandfather    History  Substance Use Topics  . Smoking status: Never Smoker   . Smokeless tobacco: Never Used  . Alcohol Use: 4.2 oz/week    7 Cans of beer per week     Comment: 04/27/13 -"a lot"  4-5 large glasses of wine and part of a fifth of vodka daily.  no beer.   OB History   Grav Para Term Preterm Abortions TAB SAB Ect Mult Living   0              Review of Systems  Constitutional: Negative  for fever.  HENT: Negative for congestion.   Respiratory: Negative for cough and shortness of breath.   Cardiovascular: Negative for chest pain.  Gastrointestinal: Negative for nausea, vomiting, abdominal pain and diarrhea.  Neurological: Positive for seizures.  All other systems reviewed and are negative.    Allergies  Review of patient's allergies indicates no known allergies.  Home Medications   Current Outpatient Rx  Name  Route  Sig  Dispense  Refill  . acetaminophen (TYLENOL) 325 MG tablet   Oral   Take 650 mg by mouth every 6 (six) hours as needed for pain.         Marland Kitchen FLUoxetine (PROZAC) 20 MG capsule   Oral   Take 3 capsules (60 mg total) by mouth daily.   90 capsule   11   . folic acid (FOLVITE) 1 MG tablet   Oral   Take 1 tablet (1 mg total) by mouth daily.   30 tablet   0   . lamoTRIgine (LAMICTAL) 200 MG tablet   Oral   Take 200 mg by mouth at bedtime.         Marland Kitchen LORazepam (ATIVAN) 1 MG tablet      Take  2 tabs tonight, then 1 tab twice daily tomorrow, then 1 tab once daily for two days, then stop.   6 tablet   0   . Multiple Vitamin (MULTIVITAMIN WITH MINERALS) TABS tablet   Oral   Take 1 tablet by mouth daily.   30 tablet   0   . norethindrone-ethinyl estradiol (MICROGESTIN,JUNEL,LOESTRIN) 1-20 MG-MCG tablet   Oral   Take 1 tablet by mouth daily.   1 Package   12   . pramoxine-hydrocortisone (ANALPRAM-HC) 1-1 % rectal cream   Rectal   Place rectally 2 (two) times daily.   30 g   1   . thiamine 100 MG tablet   Oral   Take 1 tablet (100 mg total) by mouth daily.   30 tablet   0   . valACYclovir (VALTREX) 500 MG tablet   Oral   Take 500 mg by mouth as needed (cold sores).           BP 142/74  Pulse 93  Temp(Src) 98.1 F (36.7 C) (Oral)  Resp 16  SpO2 100%  LMP 02/25/2013 Physical Exam  Nursing note and vitals reviewed. Constitutional: She is oriented to person, place, and time. She appears well-developed and  well-nourished. No distress.  HENT:  Head: Normocephalic and atraumatic.    Mouth/Throat: Oropharynx is clear and moist.  Eyes: Conjunctivae are normal. Pupils are equal, round, and reactive to light. No scleral icterus.  Neck: Normal range of motion. Neck supple. No spinous process tenderness and no muscular tenderness present.    Cardiovascular: Normal rate, regular rhythm, normal heart sounds and intact distal pulses.   No murmur heard. Pulmonary/Chest: Effort normal and breath sounds normal. No stridor. No respiratory distress. She has no rales.  Abdominal: Soft. Bowel sounds are normal. She exhibits no distension. There is no tenderness.  Musculoskeletal: Normal range of motion.       Thoracic back: She exhibits tenderness. She exhibits no deformity.       Lumbar back: She exhibits no tenderness.  Neurological: She is alert and oriented to person, place, and time. She has normal strength. No cranial nerve deficit or sensory deficit. Coordination normal. GCS eye subscore is 4. GCS verbal subscore is 5. GCS motor subscore is 6.  Skin: Skin is warm and dry. No rash noted.  Psychiatric: She has a normal mood and affect. Her behavior is normal.    ED Course  Procedures (including critical care time) Labs Review Labs Reviewed  CBC WITH DIFFERENTIAL - Abnormal; Notable for the following:    RBC 3.46 (*)    MCV 104.0 (*)    MCH 35.5 (*)    Neutrophils Relative % 81 (*)    Lymphocytes Relative 8 (*)    All other components within normal limits  BASIC METABOLIC PANEL - Abnormal; Notable for the following:    Sodium 132 (*)    Potassium 3.0 (*)    Glucose, Bld 117 (*)    All other components within normal limits   Imaging Review Dg Thoracic Spine 2 View  05/06/2013   CLINICAL DATA:  Back pain  EXAM: THORACIC SPINE - 2 VIEW  COMPARISON:  None.  FINDINGS: Vertebral body height is well maintained. The pedicles are within normal limits. No paraspinal mass lesion is noted. A somewhat  squared the apparent calcific density is noted in the right upper quadrant. This may be related to cholelithiasis.  IMPRESSION: No acute abnormality is noted in the thoracic spine.  Questionable cholelithiasis.  Electronically Signed   By: Alcide Clever M.D.   On: 05/06/2013 15:52   Ct Head Wo Contrast  05/06/2013   CLINICAL DATA:  Fall, seizure. Right-sided head trauma  EXAM: CT HEAD WITHOUT CONTRAST  TECHNIQUE: Contiguous axial images were obtained from the base of the skull through the vertex without intravenous contrast.  COMPARISON:  05/24/2009  FINDINGS: Right parietal scalp swelling is noted. No acute hemorrhage, infarct, or mass lesion is identified. No midline shift. No skull fracture. Orbits and paranasal sinuses are unremarkable.  IMPRESSION: No acute intracranial finding.  Right parietal scalp swelling.   Electronically Signed   By: Christiana Pellant M.D.   On: 05/06/2013 15:48  All radiology studies independently viewed by me.     EKG Interpretation   None       MDM   1. Seizure   2. Alcohol abuse    47 yo female with a history of alcohol abuse and seizures.  Recently hospitalized for a seizure felt to be secondary to alcohol withdrawal.  She states she has not had any alcohol for 8 days until last night, when she had a large amount.  She also missed her lamictal last night.  She had some head trauma during episode today, so head CT pending.  No C spine imaging necessary per [facility] rules.    Head CT negative.  No additional seizures.  Likely explanation for seizure is missed antiepileptic and alcohol use.  We reported wanting to quit drinking and wanted to explore option of inpatient detox.  Consulted TTS for this reason.  She stated she was drinking last night because she was depressed, but specifically denied SI.  She subsequently eloped from the ED prior to TTS eval.  Earlier in her ED course, she stated that her ex boyfriend would stay with her and she was given return precautions  for signs/symptoms consistent with seizures or DTs.      Candyce Churn, MD 05/07/13 [phone]

## 2013-05-06 NOTE — ED Notes (Signed)
Bed: RESA Expected date: 05/06/13 Expected time: 2:36 PM Means of arrival: Ambulance Comments: Post ictal/seizures

## 2013-05-06 NOTE — ED Notes (Addendum)
Pt had witnessed seizure at 1400. Pt has hx of seizures, last one a week ago, was hospitalized for this and d/c on Sunday. Pt states she missed dose of lamictal last night. Pt was post ictal upon EMS arrival. Has hematoma to R side of head after fall. Pt alert, oriented upon arrival to ED. Pt was given zofran by EMS.

## 2013-05-06 NOTE — ED Notes (Signed)
Pt denies HI/SI. States she wants to go home. Pt with family. MD informed

## 2013-05-22 ENCOUNTER — Telehealth: Payer: Self-pay | Admitting: Neurology

## 2013-05-22 NOTE — Telephone Encounter (Signed)
left message reminding patient of appointment. wed 05/24/13

## 2013-05-24 ENCOUNTER — Encounter: Payer: Self-pay | Admitting: Neurology

## 2013-05-24 ENCOUNTER — Ambulatory Visit (INDEPENDENT_AMBULATORY_CARE_PROVIDER_SITE_OTHER): Payer: BC Managed Care – PPO | Admitting: Neurology

## 2013-05-24 VITALS — BP 128/83 | HR 94 | Resp 16 | Ht 65.0 in | Wt 134.0 lb

## 2013-05-24 DIAGNOSIS — G40309 Generalized idiopathic epilepsy and epileptic syndromes, not intractable, without status epilepticus: Secondary | ICD-10-CM

## 2013-05-24 DIAGNOSIS — Z5181 Encounter for therapeutic drug level monitoring: Secondary | ICD-10-CM | POA: Insufficient documentation

## 2013-05-24 DIAGNOSIS — G40409 Other generalized epilepsy and epileptic syndromes, not intractable, without status epilepticus: Secondary | ICD-10-CM | POA: Insufficient documentation

## 2013-05-24 DIAGNOSIS — G40209 Localization-related (focal) (partial) symptomatic epilepsy and epileptic syndromes with complex partial seizures, not intractable, without status epilepticus: Secondary | ICD-10-CM | POA: Insufficient documentation

## 2013-05-24 MED ORDER — LAMOTRIGINE 200 MG PO TABS: 200.0000 mg | ORAL_TABLET | Freq: Every day | ORAL | Status: DC

## 2013-05-24 NOTE — Patient Instructions (Signed)
Alcohol and Nutrition °Nutrition serves two purposes. It provides energy. It also maintains body structure and function. Food supplies energy. It also provides the building blocks needed to replace worn or damaged cells. Alcoholics often eat poorly. This limits their supply of essential nutrients. This affects energy supply and structure maintenance. Alcohol also affects the body's nutrients in: °· Digestion. °· Storage. °· Using and getting rid of waste products. °IMPAIRMENT OF NUTRIENT DIGESTION AND UTILIZATION  °· Once ingested, food must be broken down into small components (digested). Then it is available for energy. It helps maintain body structure and function. Digestion begins in the mouth. It continues in the stomach and intestines, with help from the pancreas. The nutrients from digested food are absorbed from the intestines into the blood. Then they are carried to the liver. The liver prepares nutrients for: °· Immediate use. °· Storage and future use. °· Alcohol inhibits the breakdown of nutrients into usable molecules. °· It decreases secretion of digestive enzymes from the pancreas. °· Alcohol impairs nutrient absorption by damaging the cells lining the stomach and intestines. °· It also interferes with moving some nutrients into the blood. °· In addition, nutritional deficiencies themselves may lead to further absorption problems. °· For example, folate deficiency changes the cells that line the small intestine. This impairs how water is absorbed. It also affects absorbed nutrients. These include glucose, sodium, and additional folate. °· Even if nutrients are digested and absorbed, alcohol can prevent them from being fully used. It changes their transport, storage, and excretion. Impaired utilization of nutrients by alcoholics is indicated by: °· Decreased liver stores of vitamins, such as vitamin A. °· Increased excretion of nutrients such as fat. °ALCOHOL AND ENERGY SUPPLY  °· Three basic  nutritional components found in food are: °· Carbohydrates. °· Proteins. °· Fats. °· These are used as energy. Some alcoholics take in as much as 50% of their total daily calories from alcohol. They often neglect important foods. °· Even when enough food is eaten, alcohol can impair the ways the body controls blood sugar (glucose) levels. It may either increase or decrease blood sugar. °· In non-diabetic alcoholics, increased blood sugar (hyperglycemia) is caused by poor insulin secretion. It is usually temporary. °· Decreased blood sugar (hypoglycemia) can cause serious injury even if this condition is short-lived. Low blood sugar can happen when a fasting or malnourished person drinks alcohol. When there is no food to supply energy, stored sugar is used up. The products of alcohol inhibit forming glucose from other compounds such as amino acids. As a result, alcohol causes the brain and other body tissue to lack glucose. It is needed for energy and function. °· Alcohol is an energy source. But how the body processes and uses the energy from alcohol is complex. Also, when alcohol is substituted for carbohydrates, subjects tend to lose weight. This indicates that they get less energy from alcohol than from food. °ALCOHOL - MAINTAINING CELL STRUCTURE AND FUNCTION  °Structure °Cells are made mostly of protein. So an adequate protein diet is important for maintaining cell structure. This is especially true if cells are being damaged. Research indicates that alcohol affects protein nutrition by causing impaired: °· Digestion of proteins to amino acids. °· Processing of amino acids by the small intestine and liver. °· Synthesis of proteins from amino acids. °· Protein secretion by the liver. °Function °Nutrients are essential for the body to function well. They provide the tools that the body needs to work well:  °·   Proteins. °· Vitamins. °· Minerals. °Alcohol can disrupt body function. It may cause nutrient  deficiencies. And it may interfere with the way nutrients are processed. °Vitamins °· Vitamins are essential to maintain growth and normal metabolism. They regulate many of the body`s processes. Chronic heavy drinking causes deficiencies in many vitamins. This is caused by eating less. And, in some cases, vitamins may be poorly absorbed. For example, alcohol inhibits fat absorption. It impairs how the vitamins A, E, and D are normally absorbed along with dietary fats. Not enough vitamin A may cause night blindness. Not enough vitamin D may cause softening of the bones. °· Some alcoholics lack vitamins A, C, D, E, K, and the B vitamins. These are all involved in wound healing and cell maintenance. In particular, because vitamin K is necessary for blood clotting, lacking that vitamin can cause delayed clotting. The result is excess bleeding. Lacking other vitamins involved in brain function may cause severe neurological damage. °Minerals °Deficiencies of minerals such as calcium, magnesium, iron, and zinc are common in alcoholics. The alcohol itself does not seem to affect how these minerals are absorbed. Rather, they seem to occur secondary to other alcohol-related problems, such as: °· Less calcium absorbed. °· Not enough magnesium. °· More urinary excretion. °· Vomiting. °· Diarrhea. °· Not enough iron due to gastrointestinal bleeding. °· Not enough zinc or losses related to other nutrient deficiencies. °· Mineral deficiencies can cause a variety of medical consequences. These range from calcium-related bone disease to zinc-related night blindness and skin lesions. °ALCOHOL, MALNUTRITION, AND MEDICAL COMPLICATIONS  °Liver Disease  °· Alcoholic liver damage is caused primarily by alcohol itself. But poor nutrition may increase the risk of alcohol-related liver damage. For example, nutrients normally found in the liver are known to be affected by drinking alcohol. These include carotenoids, which are the major  sources of vitamin A, and vitamin E compounds. Decreases in such nutrients may play some role in alcohol-related liver damage. °Pancreatitis °· Research suggests that malnutrition may increase the risk of developing alcoholic pancreatitis. Research suggests that a diet lacking in protein may increase alcohol's damaging effect on the pancreas. °Brain °· Nutritional deficiencies may have severe effects on brain function. These may be permanent. Specifically, thiamine deficiencies are often seen in alcoholics. They can cause severe neurological problems. These include: °· Impaired movement. °· Memory loss seen in Wernicke-Korsakoff syndrome. °Pregnancy °· Alcohol has toxic effects on fetal development. It causes alcohol-related birth defects. They include fetal alcohol syndrome. Alcohol itself is toxic to the fetus. Also, the nutritional deficiency can affect how the fetus develops. That may compound the risk of developmental damage. °· Nutritional needs during pregnancy are 10% to 30% greater than normal. Food intake can increase by as much as 140% to cover the needs of both mother and fetus. An alcoholic mother`s nutritional problems may adversely affect the nutrition of the fetus. And alcohol itself can also restrict nutrition flow to the fetus. °NUTRITIONAL STATUS OF ALCOHOLICS  °Techniques for assessing nutritional status include: °· Taking body measurements to estimate fat reserves. They include: °· Weight. °· Height. °· Mass. °· Skin fold thickness. °· Performing blood analysis to provide measurements of circulating: °· Proteins. °· Vitamins. °· Minerals. °· These techniques tend to be imprecise. For many nutrients, there is no clear "cut-off" point that would allow an accurate definition of deficiency. So assessing the nutritional status of alcoholics is limited by these techniques. Dietary status may provide information about the risk of developing nutritional problems.   Dietary status is assessed by: °· Taking  patients' dietary histories. °· Evaluating the amount and types of food they are eating. °· It is difficult to determine what exact amount of alcohol begins to have damaging effects on nutrition. In general, moderate drinkers have 2 drinks or less per day. They seem to be at little risk for nutritional problems. Various medical disorders begin to appear at greater levels. °· Research indicates that the majority of even the heaviest drinkers have few obvious nutritional deficiencies. Many alcoholics who are hospitalized for medical complications of their disease do have severe malnutrition. Alcoholics tend to eat poorly. Often they eat less than the amounts of food necessary to provide enough: °· Carbohydrates. °· Protein. °· Fat. °· Vitamins A and C. °· B vitamins. °· Minerals like calcium and iron. °Of major concern is alcohol's effect on digesting food and use of nutrients. It may shift a mildly malnourished person toward severe malnutrition. °Document Released: 04/16/2005 Document Revised: 09/14/2011 Document Reviewed: 09/30/2005 °[employer]® Patient Information ©2014 [employer]. ° °Alcohol Problems °Most adults who drink alcohol drink in moderation (not a lot) are at low risk for developing problems related to their drinking. However, all drinkers, including low-risk drinkers, should know about the health risks connected with drinking alcohol. °RECOMMENDATIONS FOR LOW-RISK DRINKING  °Drink in moderation. Moderate drinking is defined as follows:  °· Men - no more than 2 drinks per day. °· Nonpregnant women - no more than 1 drink per day. °· Over age 65 - no more than 1 drink per day. °A standard drink is 12 grams of pure alcohol, which is equal to a 12 ounce bottle of beer or wine cooler, a 5 ounce glass of wine, or 1.5 ounces of distilled spirits (such as whiskey, brandy, vodka, or rum).  °ABSTAIN FROM (DO NOT DRINK) ALCOHOL: °· When pregnant or considering pregnancy. °· When taking a medication that interacts  with alcohol. °· If you are alcohol dependent. °· A medical condition that prohibits drinking alcohol (such as ulcer, liver disease, or heart disease). °DISCUSS WITH YOUR CAREGIVER: °· If you are at risk for coronary heart disease, discuss the potential benefits and risks of alcohol use: Light to moderate drinking is associated with lower rates of coronary heart disease in certain populations (for example, men over age 45 and postmenopausal women). Infrequent or nondrinkers are advised not to begin light to moderate drinking to reduce the risk of coronary heart disease so as to avoid creating an alcohol-related problem. Similar protective effects can likely be gained through proper diet and exercise. °· Women and the elderly have smaller amounts of body water than men. As a result women and the elderly achieve a higher blood alcohol concentration after drinking the same amount of alcohol. °· Exposing a fetus to alcohol can cause a broad range of birth defects referred to as Fetal Alcohol Syndrome (FAS) or Alcohol-Related Birth Defects (ARBD). Although FAS/ARBD is connected with excessive alcohol consumption during pregnancy, studies also have reported neurobehavioral problems in infants born to mothers reporting drinking an average of 1 drink per day during pregnancy. °· Heavier drinking (the consumption of more than 4 drinks per occasion by men and more than 3 drinks per occasion by women) impairs learning (cognitive) and psychomotor functions and increases the risk of alcohol-related problems, including accidents and injuries. °CAGE QUESTIONS:  °· Have you ever felt that you should Cut down on your drinking? °· Have people Annoyed you by criticizing your drinking? °· Have you ever   felt bad or Guilty about your drinking? °· Have you ever had a drink first thing in the morning to steady your nerves or get rid of a hangover (Eye opener)? °If you answered positively to any of these questions: You may be at risk for  alcohol-related problems if alcohol consumption is:  °· Men: Greater than 14 drinks per week or more than 4 drinks per occasion. °· Women: Greater than 7 drinks per week or more than 3 drinks per occasion. °Do you or your family have a medical history of alcohol-related problems, such as: °· Blackouts. °· Sexual dysfunction. °· Depression. °· Trauma. °· Liver dysfunction. °· Sleep disorders. °· Hypertension. °· Chronic abdominal pain. °· Has your drinking ever caused you problems, such as problems with your family, problems with your work (or school) performance, or accidents/injuries? °· Do you have a compulsion to drink or a preoccupation with drinking? °· Do you have poor control or are you unable to stop drinking once you have started? °· Do you have to drink to avoid withdrawal symptoms? °· Do you have problems with withdrawal such as tremors, nausea, sweats, or mood disturbances? °· Does it take more alcohol than in the past to get you high? °· Do you feel a strong urge to drink? °· Do you change your plans so that you can have a drink? °· Do you ever drink in the morning to relieve the shakes or a hangover? °If you have answered a number of the previous questions positively, it may be time for you to talk to your caregivers, family, and friends and see if they think you have a problem. Alcoholism is a chemical dependency that keeps getting worse and will eventually destroy your health and relationships. Many alcoholics end up dead, impoverished, or in prison. This is often the end result of all chemical dependency. °· Do not be discouraged if you are not ready to take action immediately. °· Decisions to change behavior often involve up and down desires to change and feeling like you cannot decide. °· Try to think more seriously about your drinking behavior. °· Think of the reasons to quit. °WHERE TO GO FOR ADDITIONAL INFORMATION  °· The National Institute on Alcohol Abuse and Alcoholism  (NIAAA) °www.niaaa.nih.gov °· National Council on Alcoholism and Drug Dependence (NCADD) °www.ncadd.org °· American Society of Addiction Medicine (ASAM) °www.asam.org  °Document Released: 06/22/2005 Document Revised: 09/14/2011 Document Reviewed: 02/08/2008 °ExitCare® Patient Information ©2014 [employer]. ° °

## 2013-05-24 NOTE — Progress Notes (Signed)
[facility]  Provider:  Dr Deshaun Weisinger Referring Provider:Primary Care Physician:  Sabino Snipes . np  Chief Complaint  Patient presents with  . Seizures    6 MO F/U     HPI:  Robin Henderson is a 47 y.o. female here as a referral from Dr. Maryelizabeth Rowan . Patient is currently on Prozac, 20 mg daily Lamictal 100 mg by mouth daily Loestrin 1/20 mg microgram. Bactrim 800 mg twice daily beginning 11/16/2012 and Valtrex as needed 5 mg patient. The patient has been seen for about 4/2 years of this practice in the diet after she initially had a cluster of several seizures or. 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 She was briefly hospitalized once for seizures and her EEG showed abnormal generalized discharges as polyspike wave pattern.  She presents today after 25 months of seizure freedom With another seizure that happened after she drank , relapsed October 24 th.  The emergency services came to the office - further care at the emergency room. She was again placed on driving restrictions for 6 month.  She understood that she needs to be seizure free for that period.     Review of Systems: Out of a complete 14 system review, the patient complains of only the following symptoms, and all other reviewed systems are negative. Recent seizure , after Vomiting, fever and chills , insomnia,  UTI ,dehydration.    History   Social History  . Marital Status: Single    Spouse Name: N/A    Number of Children: N/A  . Years of Education: N/A   Occupational History  . Not on file.   Social History Main Topics  . Smoking status: Never Smoker   . Smokeless tobacco: Never Used  . Alcohol Use: 4.2 oz/week    7 Cans of beer per week     Comment: 04/27/13 -"a lot"  4-5 large glasses of wine and part of a fifth of vodka daily.  no beer.  . Drug Use: No  . Sexual Activity:  Yes    Birth Control/ Protection: Pill   Other Topics Concern  . Not on file   Social History Narrative   Works.  Moving to new apartment to be nearer her boyfriend.      Family History  Problem Relation Age of Onset  . Heart disease Mother   . Epilepsy Sister   . Epilepsy Brother   . Depression Brother     with suicide attempts  . Depression Sister     possible suicide attempt  . Alcohol abuse Mother   . Alcohol abuse Maternal Grandfather     Past Medical History  Diagnosis Date  . 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 Anxiety and depression   . Suicide attempt     x 2.  Almost slit wrists and almost jumped off a parking deck  . Hemorrhoids   . Alcohol abuse     Past Surgical History  Procedure Laterality Date  . External ear surgery      correct earing hole  . Head & neck skin lesion excisional biopsy      lip lesion    Current Outpatient Prescriptions  Medication Sig Dispense Refill  . FLUoxetine (PROZAC) 20 MG capsule Take 3 capsules (60 mg total) by mouth daily.  90 capsule  11  . lamoTRIgine (LAMICTAL) 200 MG tablet Take 200 mg by mouth at bedtime.      . Multiple Vitamin (MULTIVITAMIN WITH MINERALS) TABS tablet Take 1 tablet by mouth daily.  30 tablet  0  . norethindrone-ethinyl estradiol (MICROGESTIN,JUNEL,LOESTRIN) 1-20 MG-MCG tablet Take 1 tablet by mouth daily.  1 Package  12  . acetaminophen (TYLENOL) 325 MG tablet Take 650 mg by mouth every 6 (six) hours as needed for pain.      . valACYclovir (VALTREX) 500 MG tablet Take 500 mg by mouth as needed (cold sores).        No current facility-administered medications for this visit.    Allergies as of 05/24/2013  . (No Known Allergies)    Vitals: BP 128/83  Pulse 94  Resp 16  Ht 5\' 5"  (1.651 m)  Wt 134 lb (60.782 kg)  BMI 22.30 kg/m2  LMP 02/25/2013 Last Weight:  Wt Readings from Last 1 Encounters:  05/24/13 134 lb (60.782 kg)   Last Height:   Ht Readings from Last 1 Encounters:   05/24/13 5\' 5"  (1.651 m)   Vision Screening:   Physical exam:  General: The patient is awake, alert and appears not in acute distress. The patient is well groomed. Head: Normocephalic, atraumatic. Neck is supple. Mallampati1,  Cardiovascular:  Regular rate and rhythm, without  murmurs or carotid bruit, and without distended neck veins. Respiratory: Lungs are clear to auscultation. Skin:  Without evidence of edema, or rash- has bruising over the right  knee.  tanned.  Trunk: BMI is normal.  Neurologic exam : The patient is awake and alert, oriented to place and time.  Memory subjective  described as intact.  There is a normal attention span & concentration ability. Speech is fluent without  dysarthria, dysphonia or aphasia.  Mood and affect are concerned , worried.   Cranial nerves: Pupils are equal and briskly reactive to light. Funduscopic exam without  evidence of pallor or edema.  Extraocular movements  in vertical and horizontal planes intact and without nystagmus. Visual fields by finger perimetry are intact. Hearing to finger rub intact.  Facial sensation intact to fine touch.  Facial motor strength is symmetric and tongue and uvula move midline. No tongue bite !  Motor exam: Normal tone and normal muscle bulk and symmetric normal strength in all extremities.  Sensory:  Fine touch, pinprick and vibration were tested in all extremities.  Proprioception is tested in the upper extremities only.   Coordination: Rapid alternating movements in the fingers/hands is tested and normal.  Finger-to-nose maneuver tested and normal without evidence of ataxia, dysmetria or tremor.  Gait and station: Patient walks without assistive device . Strength within normal limits.  Stance is stable and normal.  Steps are unfragmented. Romberg testing is normal.  Deep tendon reflexes: in the  upper and lower extremities are symmetric and intact. Babinski maneuver response is   downgoing.   Assessment:  After physical and neurologic examination, review of laboratory studies, imaging, neurophysiology testing and pre-existing records,  assessment is that of a patient with myoclonic epilepsy and poor seizure control , because of  Alcohol abuse.    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 [employer] . May apply for FMLA. Lamictal to be further increased to 200 mg daily.  She is not drinking, but has had relapses in October 2014 . She denies any suicidal ideation.

## 2013-06-07 ENCOUNTER — Telehealth: Payer: Self-pay | Admitting: Neurology

## 2013-06-07 NOTE — Telephone Encounter (Signed)
This is an error, explained phone process to patient, completed.

## 2013-06-13 DIAGNOSIS — Z0289 Encounter for other administrative examinations: Secondary | ICD-10-CM

## 2013-06-15 ENCOUNTER — Encounter (HOSPITAL_COMMUNITY): Payer: Self-pay | Admitting: Emergency Medicine

## 2013-06-15 ENCOUNTER — Emergency Department (HOSPITAL_COMMUNITY)
Admission: EM | Admit: 2013-06-15 | Discharge: 2013-06-15 | Disposition: A | Discharge status: Home / Self Care | Payer: BC Managed Care – PPO | Attending: Emergency Medicine | Admitting: Emergency Medicine

## 2013-06-15 DIAGNOSIS — F329 Major depressive disorder, single episode, unspecified: Secondary | ICD-10-CM | POA: Insufficient documentation

## 2013-06-15 DIAGNOSIS — F29 Unspecified psychosis not due to a substance or known physiological condition: Secondary | ICD-10-CM | POA: Insufficient documentation

## 2013-06-15 DIAGNOSIS — F3289 Other specified depressive episodes: Secondary | ICD-10-CM | POA: Insufficient documentation

## 2013-06-15 DIAGNOSIS — F411 Generalized anxiety disorder: Secondary | ICD-10-CM | POA: Insufficient documentation

## 2013-06-15 DIAGNOSIS — Z79899 Other long term (current) drug therapy: Secondary | ICD-10-CM | POA: Insufficient documentation

## 2013-06-15 DIAGNOSIS — G40909 Epilepsy, unspecified, not intractable, without status epilepticus: Secondary | ICD-10-CM | POA: Insufficient documentation

## 2013-06-15 DIAGNOSIS — R569 Unspecified convulsions: Secondary | ICD-10-CM

## 2013-06-15 DIAGNOSIS — Z8679 Personal history of other diseases of the circulatory system: Secondary | ICD-10-CM | POA: Insufficient documentation

## 2013-06-15 LAB — CBC WITH DIFFERENTIAL/PLATELET
Basophils Absolute: 0 10*3/uL (ref 0.0–0.1)
Eosinophils Absolute: 0 10*3/uL (ref 0.0–0.7)
Eosinophils Relative: 0 % (ref 0–5)
HCT: 33.5 % — ABNORMAL LOW (ref 36.0–46.0)
Lymphs Abs: 1.2 10*3/uL (ref 0.7–4.0)
MCH: 33.3 pg (ref 26.0–34.0)
MCV: 98.8 fL (ref 78.0–100.0)
Monocytes Absolute: 0.5 10*3/uL (ref 0.1–1.0)
Platelets: 260 10*3/uL (ref 150–400)
RDW: 13.4 % (ref 11.5–15.5)

## 2013-06-15 LAB — BASIC METABOLIC PANEL
Calcium: 9.5 mg/dL (ref 8.4–10.5)
Creatinine, Ser: 0.8 mg/dL (ref 0.50–1.10)
GFR calc non Af Amer: 88 mL/min — ABNORMAL LOW (ref >90)
Glucose, Bld: 112 mg/dL — ABNORMAL HIGH (ref 70–99)
Sodium: 135 mEq/L (ref 135–145)

## 2013-06-15 MED ORDER — LAMOTRIGINE 200 MG PO TABS
200.0000 mg | ORAL_TABLET | Freq: Every day | ORAL | Status: DC
  Administered 2013-06-15: 200 mg via ORAL
  Filled 2013-06-15: qty 1

## 2013-06-15 MED ORDER — LORAZEPAM 2 MG/ML IJ SOLN
1.0000 mg | Freq: Once | INTRAMUSCULAR | Status: AC
  Administered 2013-06-15: 1 mg via INTRAVENOUS
  Filled 2013-06-15: qty 1

## 2013-06-15 NOTE — ED Provider Notes (Signed)
Medical screening examination/treatment/procedure(s) were performed by non-physician practitioner and as supervising physician I was immediately available for consultation/collaboration.  EKG Interpretation   None         Cassidie Veiga T Chaundra Abreu, MD 06/15/13 2249 

## 2013-06-15 NOTE — ED Notes (Signed)
Bed: WA18 Expected date:  Expected time:  Means of arrival:  Comments: EMS-Seizure 

## 2013-06-15 NOTE — ED Provider Notes (Signed)
CSN: [id number]     Arrival date & time 06/15/13  1446 History   First MD Initiated Contact with Patient 06/15/13 1511     Chief Complaint  Patient presents with  . Seizures   (Consider location/radiation/quality/duration/timing/severity/associated sxs/prior Treatment) HPI Comments: Patient with a history of Epilepsy currently on Lamictal presents today after having a seizure just prior to arrival.  She reports that the seizure occurred while at work.  She states that she was sitting at her desk and then fell to the ground.  She reports that she only had one episode of seizure activity and is unsure how long it lasted.  She denies any bowel or bladder incontinence associated with the seizure.  She denies headache at this time, but reports that she does feel slightly confused.  She does have a history of alcohol abuse, but denies any alcohol use in the past month.  Denies recreational drug use.  She reports that she did forget to take her dose of Lamictal last evening.  She denies any recent illness.  She reports that she recently had a relationship end and has been feeling more anxious recently.  She denies SI or HI. She reports that she has not been sleeping well due to the anxiety.  She denies chest pain, nausea, vomiting, fever, or chills.  The history is provided by the patient.    Past Medical History  Diagnosis Date  . 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 Anxiety and depression   . Suicide attempt     x 2.  Almost slit wrists and almost jumped off a parking deck  . Hemorrhoids   . Alcohol abuse    Past Surgical History  Procedure Laterality Date  . External ear surgery      correct earing hole  . Head & neck skin lesion excisional biopsy      lip lesion   Family History  Problem Relation Age of Onset  . Heart disease Mother   . Epilepsy Sister   . Epilepsy Brother   . Depression Brother     with suicide attempts  . Depression Sister     possible suicide attempt   . Alcohol abuse Mother   . Alcohol abuse Maternal Grandfather    History  Substance Use Topics  . Smoking status: Never Smoker   . Smokeless tobacco: Never Used  . Alcohol Use: 4.2 oz/week    7 Cans of beer per week     Comment: 04/27/13 -"a lot"  4-5 large glasses of wine and part of a fifth of vodka daily.  no beer.   OB History   Grav Para Term Preterm Abortions TAB SAB Ect Mult Living   0              Review of Systems  All other systems reviewed and are negative.    Allergies  Review of patient's allergies indicates no known allergies.  Home Medications   Current Outpatient Rx  Name  Route  Sig  Dispense  Refill  . FLUoxetine (PROZAC) 20 MG capsule   Oral   Take 3 capsules (60 mg total) by mouth daily.   90 capsule   11   . lamoTRIgine (LAMICTAL) 200 MG tablet   Oral   Take 1 tablet (200 mg total) by mouth at bedtime.   90 tablet   3   . Multiple Vitamin (MULTIVITAMIN WITH MINERALS) TABS tablet   Oral   Take  1 tablet by mouth daily.   30 tablet   0   . norethindrone-ethinyl estradiol (MICROGESTIN,JUNEL,LOESTRIN) 1-20 MG-MCG tablet   Oral   Take 1 tablet by mouth daily.   1 Package   12    BP 134/78  Pulse 93  Temp(Src) 98.3 F (36.8 C) (Oral)  Resp 18  Ht 5\' 4"  (1.626 m)  Wt 123 lb (55.792 kg)  BMI 21.10 kg/m2  SpO2 100% Physical Exam  Nursing note and vitals reviewed. Constitutional: She appears well-developed and well-nourished.  HENT:  Head: Normocephalic and atraumatic.  Mouth/Throat: Oropharynx is clear and moist.  Eyes: EOM are normal. Pupils are equal, round, and reactive to light.  Neck: Normal range of motion. Neck supple.  Cardiovascular: Normal rate, regular rhythm and normal heart sounds.   Pulmonary/Chest: Effort normal and breath sounds normal.  Abdominal: Soft. Bowel sounds are normal. She exhibits no distension and no mass. There is no tenderness. There is no rebound and no guarding.  Musculoskeletal: Normal range of  motion.       Cervical back: She exhibits normal range of motion, no tenderness, no bony tenderness, no swelling, no edema and no deformity.       Thoracic back: She exhibits normal range of motion, no tenderness, no bony tenderness, no swelling, no edema and no deformity.       Lumbar back: She exhibits normal range of motion, no tenderness, no bony tenderness, no swelling, no edema and no deformity.  Neurological: She is alert. She has normal strength. No cranial nerve deficit or sensory deficit. Coordination and gait normal.  Skin: Skin is warm and dry.  Psychiatric: Her mood appears anxious.    ED Course  Procedures (including critical care time) Labs Review Labs Reviewed  GLUCOSE, CAPILLARY - Abnormal; Notable for the following:    Glucose-Capillary 138 (*)    All other components within normal limits  CBC WITH DIFFERENTIAL  BASIC METABOLIC PANEL   Imaging Review No results found.  EKG Interpretation   None       MDM  No diagnosis found. Patient with a history of Epilepsy presents today after a seizure.  Patient reports she forgot to take her dose of Lamictal last evening, which is most likely the reason for the seizure today.  Patient also reports that she has been more anxious and has not been sleeping well.  No seizures during ED course.  Labs unremarkable.  Patient without signs of head trauma and known seizure disorder.  Therefore, do not feel that head imaging is indicated at this time.  Patient at baseline with normal neurological exam at time of discharge. Patient instructed to take Lamictal as directed.  Return precautions given.    Santiago Glad, PA-C 06/15/13 2100

## 2013-06-15 NOTE — ED Notes (Signed)
Pt post ictal after having one seizure while at work.  This occurred PTA.  Denies pain at this time.

## 2013-06-20 ENCOUNTER — Telehealth: Payer: Self-pay | Admitting: Neurology

## 2013-06-20 NOTE — Telephone Encounter (Signed)
FMLA Form faxed to [phone]. Originals mailed to patient. Copy to medial records.

## 2013-06-20 NOTE — Telephone Encounter (Signed)
Was hospitalized on Thursday for a seizure and Dr. Should be able to go in and see what's going on and she has the shakes really bad. Wants to speak with Dr. Melynda Keller Nurse.

## 2013-06-20 NOTE — Telephone Encounter (Signed)
[facility]. Hasn't had alcohol in 14-15 hours. Didn't go to work because began to have shaking, vomiting, symptoms suggestive of seizure. Has been out of work since Friday. Sent in [facility] paperwork about two weeks ago. Can Dr. Vickey Huger send letter or FMLA. Going to [employer].   I let patient know that her FMLA forms are going to be done by me today and I will fax them out today. They will cover a time period from Thursday 12/11/204 through 07/06/2013 and 3-5 days per month as needed. This is for a period of six months. Dr. Vickey Huger wants patient to well understand that these seizures are associated with alcoholism and withdrawal. Going to AA is not adequate because patient needs an alcohol specialist to follow her and provide care.   I see a referral was started by Melina Copa on 04/28/2013 for care r/t alcoholism. I strongly encouraged patient to follow up with an alcohol counselor to follow her care and also educate her on the rights and protections of individuals with drug and alcohol addiction diagnosis. Patient will contact Melina Copa now.

## 2013-06-22 ENCOUNTER — Telehealth: Payer: Self-pay

## 2013-06-22 NOTE — Telephone Encounter (Signed)
Message copied by [facility] on Thu Jun 22, 2013 12:51 PM ------      Message from: Ezekiel Slocumb ROSE ANN S      Created: Wed Jun 21, 2013  8:49 AM      Regarding: FMLA paperwork       Contact: [phone]       Lamar Laundry from [facility] has a question about the fax you sent her, about FMLA paperwork for patient. Please call Sonya at the number listed above, extension [phone]. Thank you  ------

## 2013-06-22 NOTE — Telephone Encounter (Signed)
I returned the call to [facility] from [facility]. I left a VM that I was returning her call. I called patient to let her know that Sonya from HR called. Perhaps it would be better for HIPAA if patient called to see what was needed and patient could let me know. Patient stated she would do that.

## 2014-04-24 ENCOUNTER — Ambulatory Visit: Payer: BC Managed Care – PPO | Admitting: Neurology

## 2014-10-10 ENCOUNTER — Other Ambulatory Visit (HOSPITAL_COMMUNITY)
Admission: RE | Admit: 2014-10-10 | Discharge: 2014-10-10 | Disposition: A | Discharge status: Home / Self Care | Payer: Self-pay | Source: Ambulatory Visit | Attending: Gynecology | Admitting: Gynecology

## 2014-10-10 ENCOUNTER — Other Ambulatory Visit (HOSPITAL_COMMUNITY): Admission: RE | Admit: 2014-10-10 | Payer: Self-pay | Source: Ambulatory Visit | Admitting: Gynecology

## 2014-10-10 ENCOUNTER — Ambulatory Visit (INDEPENDENT_AMBULATORY_CARE_PROVIDER_SITE_OTHER): Payer: Self-pay | Admitting: Women's Health

## 2014-10-10 ENCOUNTER — Encounter: Payer: Self-pay | Admitting: Women's Health

## 2014-10-10 VITALS — BP 130/80 | Ht 65.0 in | Wt 134.0 lb

## 2014-10-10 DIAGNOSIS — Z833 Family history of diabetes mellitus: Secondary | ICD-10-CM

## 2014-10-10 DIAGNOSIS — Z01419 Encounter for gynecological examination (general) (routine) without abnormal findings: Secondary | ICD-10-CM | POA: Insufficient documentation

## 2014-10-10 DIAGNOSIS — Z30011 Encounter for initial prescription of contraceptive pills: Secondary | ICD-10-CM

## 2014-10-10 LAB — GLUCOSE, RANDOM: GLUCOSE: 90 mg/dL (ref 70–99)

## 2014-10-10 MED ORDER — NORETHIN ACE-ETH ESTRAD-FE 1-20 MG-MCG PO TABS
1.0000 | ORAL_TABLET | Freq: Every day | ORAL | Status: DC
Start: 2014-10-10 — End: 2021-02-07
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
## 2014-10-10 NOTE — Patient Instructions (Signed)

## 2014-10-11 LAB — URINALYSIS, MICROSCOPIC ONLY
Bacteria, UA: NONE SEEN
CASTS: NONE SEEN
CRYSTALS: NONE SEEN

## 2014-10-11 LAB — URINALYSIS, ROUTINE W REFLEX MICROSCOPIC
Bilirubin Urine: NEGATIVE
Glucose, UA: NEGATIVE mg/dL
KETONES UR: NEGATIVE mg/dL
Leukocytes, UA: NEGATIVE
Nitrite: NEGATIVE
SPECIFIC GRAVITY, URINE: 1.012 (ref 1.005–1.030)
Urobilinogen, UA: 0.2 mg/dL (ref 0.0–1.0)
pH: 5 (ref 5.0–8.0)

## 2014-10-11 LAB — CYTOLOGY - PAP

## 2015-08-05 MED FILL — NORETHIN-ESTRAD-FERR 1-0.02: 1-20 | 84 days supply | Qty: 84 | Fill #6

## 2016-05-26 MED FILL — POLYMYXIN B/TMP EYE DROPS: 10000-0.1 | 7 days supply | Qty: 10 | Fill #0

## 2016-05-26 MED FILL — BENZONATATE 200 MG CAPSULE: 200 | 10 days supply | Qty: 30 | Fill #0

## 2016-05-26 MED FILL — AZITHROMYCIN 250 MG TABLET: 250 | 5 days supply | Qty: 6 | Fill #0

## 2016-11-05 ENCOUNTER — Emergency Department (HOSPITAL_COMMUNITY)
Admission: EM | Admit: 2016-11-05 | Discharge: 2016-11-05 | Disposition: A | Discharge status: Home / Self Care | Payer: Self-pay | Attending: Emergency Medicine | Admitting: Emergency Medicine

## 2016-11-05 ENCOUNTER — Encounter (HOSPITAL_COMMUNITY): Payer: Self-pay | Admitting: Emergency Medicine

## 2016-11-05 DIAGNOSIS — F10230 Alcohol dependence with withdrawal, uncomplicated: Secondary | ICD-10-CM | POA: Insufficient documentation

## 2016-11-05 DIAGNOSIS — F10930 Alcohol use, unspecified with withdrawal, uncomplicated: Secondary | ICD-10-CM

## 2016-11-05 DIAGNOSIS — Z79899 Other long term (current) drug therapy: Secondary | ICD-10-CM | POA: Insufficient documentation

## 2016-11-05 LAB — CBC WITH DIFFERENTIAL/PLATELET
Basophils Absolute: 0 10*3/uL (ref 0.0–0.1)
Basophils Relative: 0 %
Eosinophils Absolute: 0 10*3/uL (ref 0.0–0.7)
Eosinophils Relative: 0 %
HCT: 32.5 % — ABNORMAL LOW (ref 36.0–46.0)
HEMOGLOBIN: 11.2 g/dL — AB (ref 12.0–15.0)
LYMPHS ABS: 1.6 10*3/uL (ref 0.7–4.0)
LYMPHS PCT: 35 %
MCH: 34.5 pg — AB (ref 26.0–34.0)
MCHC: 34.5 g/dL (ref 30.0–36.0)
MCV: 100 fL (ref 78.0–100.0)
Monocytes Absolute: 0.5 10*3/uL (ref 0.1–1.0)
Monocytes Relative: 12 %
NEUTROS PCT: 53 %
Neutro Abs: 2.4 10*3/uL (ref 1.7–7.7)
PLATELETS: 295 10*3/uL (ref 150–400)
RBC: 3.25 MIL/uL — ABNORMAL LOW (ref 3.87–5.11)
RDW: 12.5 % (ref 11.5–15.5)
WBC: 4.5 10*3/uL (ref 4.0–10.5)

## 2016-11-05 LAB — COMPREHENSIVE METABOLIC PANEL
ALT: 18 U/L (ref 14–54)
ANION GAP: 12 (ref 5–15)
AST: 43 U/L — ABNORMAL HIGH (ref 15–41)
Albumin: 4 g/dL (ref 3.5–5.0)
Alkaline Phosphatase: 64 U/L (ref 38–126)
BUN: 14 mg/dL (ref 6–20)
CO2: 25 mmol/L (ref 22–32)
Calcium: 9.2 mg/dL (ref 8.9–10.3)
Chloride: 96 mmol/L — ABNORMAL LOW (ref 101–111)
Creatinine, Ser: 1.1 mg/dL — ABNORMAL HIGH (ref 0.44–1.00)
GFR, EST NON AFRICAN AMERICAN: 58 mL/min — AB (ref >60)
Glucose, Bld: 100 mg/dL — ABNORMAL HIGH (ref 65–99)
POTASSIUM: 3.6 mmol/L (ref 3.5–5.1)
SODIUM: 133 mmol/L — AB (ref 135–145)
Total Bilirubin: 2.9 mg/dL — ABNORMAL HIGH (ref 0.3–1.2)
Total Protein: 6.7 g/dL (ref 6.5–8.1)

## 2016-11-05 LAB — ETHANOL

## 2016-11-05 MED ORDER — SODIUM CHLORIDE 0.9 % IV BOLUS (SEPSIS)
1000.0000 mL | Freq: Once | INTRAVENOUS | Status: AC
  Administered 2016-11-05: 1000 mL via INTRAVENOUS

## 2016-11-05 MED ORDER — CHLORDIAZEPOXIDE HCL 25 MG PO CAPS: ORAL_CAPSULE | ORAL | 0 refills | Status: DC

## 2016-11-05 MED ORDER — LORAZEPAM 2 MG/ML IJ SOLN
1.0000 mg | Freq: Once | INTRAMUSCULAR | Status: AC
  Administered 2016-11-05: 1 mg via INTRAVENOUS
  Filled 2016-11-05: qty 1

## 2016-11-05 NOTE — ED Triage Notes (Signed)
Patient reports she is going through alcohol withdrawal. Reports last alcoholic drink was 8:20 pm on Tuesday. Reports she normally has 2-3 glasses of wine per day. Reports nausea/headache. Hx of epilepsy.

## 2016-11-05 NOTE — ED Provider Notes (Signed)
[facility] DEPT Provider Note   CSN: [id number] Arrival date & time: 11/05/16  0903     History   Chief Complaint Chief Complaint  Patient presents with  . Withdrawal    HPI Robin Henderson is a 51 y.o. female.  HPI  Patient presents with concern of Acute alcohol withdrawal. Patient acknowledges a history of seizures, has not taken seizure medicine in about 3 years, has had not a seizure at about the same time. Patient drinks daily, approximately 2 or 3 glasses of wine. Last drink was 3 days ago. She now complains of nausea, vomiting, tremulousness. Patient denies confusion, syncope, focal pain. Since onset she has also had anorexia, difficulty with tolerating oral fluids, solids per She denies suicidal thoughts, ideation, hallucinations. She is compared by her boyfriend, who assists with the history of present illness.    Past Medical History:  Diagnosis Date  . Alcohol abuse   . Anxiety and depression   . Epilepsy ([place])    grand mal seizures < 1 minute  . Hemorrhoids   . Suicide attempt ([place])    x 2.  Almost slit wrists and almost jumped off a parking deck    Patient Active Problem List   Diagnosis Date Noted  . 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 Encounter for medication monitoring 05/24/2013  . Hypokalemia 04/28/2013  . Alcohol withdrawal ([place]) 04/27/2013  . Metabolic acidosis, increased anion gap 04/27/2013  . Hyperglycemia 04/27/2013  . Macrocytosis without anemia 04/27/2013  . Epilepsy ([place])   . Anxiety and depression   . Suicide attempt ([place])   . Alcohol abuse   . 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 Depression 10/06/2012  . Alcohol abuse, in remission 05/06/2012  . Restless legs syndrome (RLS) 05/06/2012  . Other sleep disturbances 05/06/2012  . Generalized convulsive epilepsy without mention of intractable epilepsy 05/06/2012    Past Surgical History:   Procedure Laterality Date  . EXTERNAL EAR SURGERY     correct earing hole  . HEAD & NECK SKIN LESION EXCISIONAL BIOPSY     lip lesion    OB History    Gravida Para Term Preterm AB Living   0             SAB TAB Ectopic Multiple Live Births                   Home Medications    Prior to Admission medications   Medication Sig Start Date End Date Taking? Authorizing Provider  norethindrone-ethinyl estradiol (JUNEL FE,GILDESS FE,LOESTRIN FE) 1-20 MG-MCG tablet Take 1 tablet by mouth daily. 10/10/14   Huel Cote, NP    Family History Family History  Problem Relation Age of Onset  . Heart disease Mother   . Alcohol abuse Mother   . Epilepsy Sister   . Epilepsy Brother   . Depression Brother     with suicide attempts  . Depression Sister     possible suicide attempt  . Alcohol abuse Maternal Grandfather     Social History Social History  Substance Use Topics  . Smoking status: Never Smoker  . Smokeless tobacco: Never Used  . Alcohol use 4.2 oz/week    7 Cans of beer per week     Comment: 04/27/13 -"a lot"  4-5 large glasses of wine and part of a fifth of vodka daily.  no beer.     Allergies   Patient has  no known allergies.   Review of Systems Review of Systems  Constitutional:       Per HPI, otherwise negative  HENT:       Per HPI, otherwise negative  Respiratory:       Per HPI, otherwise negative  Cardiovascular:       Per HPI, otherwise negative  Gastrointestinal: Positive for nausea and vomiting.  Endocrine:       Negative aside from HPI  Genitourinary:       Neg aside from HPI   Musculoskeletal:       Per HPI, otherwise negative  Skin: Negative.   Neurological: Positive for seizures.  Psychiatric/Behavioral: The patient is nervous/anxious.      Physical Exam Updated Vital Signs BP (!) 146/91 (BP Location: Right Arm)   Pulse 94   Temp 98.2 F (36.8 C) (Oral)   Resp 16   Ht 5\' 4"  (1.626 m)   Wt 128 lb (58.1 kg)   LMP 10/27/2016    SpO2 100%   BMI 21.97 kg/m   Physical Exam  Constitutional: She is oriented to person, place, and time. She appears well-developed and well-nourished. No distress.  HENT:  Head: Normocephalic and atraumatic.  Eyes: Conjunctivae and EOM are normal.  Cardiovascular: Normal rate and regular rhythm.   Pulmonary/Chest: Effort normal and breath sounds normal. No stridor. No respiratory distress.  Abdominal: She exhibits no distension. There is no tenderness. There is no guarding.  Musculoskeletal: She exhibits no edema.  Neurological: She is alert and oriented to person, place, and time. She displays no atrophy. No cranial nerve deficit. She displays no seizure activity.  Skin: Skin is warm and dry.  Psychiatric: She has a normal mood and affect.  Nursing note and vitals reviewed.    ED Treatments / Results  Labs (all labs ordered are listed, but only abnormal results are displayed) Labs Reviewed  COMPREHENSIVE METABOLIC PANEL - Abnormal; Notable for the following:       Result Value   Sodium 133 (*)    Chloride 96 (*)    Glucose, Bld 100 (*)    Creatinine, Ser 1.10 (*)    AST 43 (*)    Total Bilirubin 2.9 (*)    GFR calc non Af Amer 58 (*)    All other components within normal limits  CBC WITH DIFFERENTIAL/PLATELET - Abnormal; Notable for the following:    RBC 3.25 (*)    Hemoglobin 11.2 (*)    HCT 32.5 (*)    MCH 34.5 (*)    All other components within normal limits  ETHANOL     Procedures Procedures (including critical care time)  Medications Ordered in ED Medications  sodium chloride 0.9 % bolus 1,000 mL (not administered)  LORazepam (ATIVAN) injection 1 mg (not administered)     Initial Impression / Assessment and Plan / ED Course  I have reviewed the triage vital signs and the nursing notes.  Pertinent labs & imaging results that were available during my care of the patient were reviewed by me and considered in my medical decision making (see chart for  details).  11:17 AM Patient calm.  She states that she feels better.  Nausea and HA improved.  No seizure activity.  12:15 PM Patient continues to improve.   This patient with a history of seizures, as well as alcohol dependency presents with concern for tremulousness, nausea, anorexia, ongoing withdrawal. Patient awake and alert, and although she is mildly dehydrated, there is no evidence for  complicated withdrawal. Patient received fluid resuscitation, and initiation of benzodiazepine therapy, and was appropriate for further management of her alcohol dependency as an outpatient.   Final Clinical Impressions(s) / ED Diagnoses  Alcohol withdrawal   Carmin Muskrat, MD 11/05/16 1216

## 2016-11-18 ENCOUNTER — Encounter: Payer: Self-pay | Admitting: Gynecology

## 2018-05-19 MED FILL — BENZONATATE 200 MG CAPS: 200 | 30 days supply | Qty: 90 | Fill #0

## 2018-05-19 MED FILL — predniSONE 5 MG (21) TBPK: 5 | 6 days supply | Qty: 21 | Fill #0

## 2018-05-19 MED FILL — AZITHROMYCIN 250 MG TABLET: 250 | 6 days supply | Qty: 6 | Fill #0

## 2019-05-11 ENCOUNTER — Other Ambulatory Visit: Payer: Self-pay | Admitting: Women's Health

## 2019-05-11 DIAGNOSIS — Z1231 Encounter for screening mammogram for malignant neoplasm of breast: Secondary | ICD-10-CM

## 2019-07-05 ENCOUNTER — Ambulatory Visit: Payer: Self-pay

## 2019-09-21 ENCOUNTER — Ambulatory Visit: Payer: Self-pay | Attending: Family

## 2019-09-21 DIAGNOSIS — Z23 Encounter for immunization: Secondary | ICD-10-CM

## 2019-09-21 NOTE — Progress Notes (Signed)
   Covid-19 Vaccination Clinic  Name:  Payten Goldsborough    MRN: [id number] DOB: [birth year]  09/21/2019  Robin Henderson was observed post Covid-19 immunization for 15 minutes without incident. She was provided with Vaccine Information Sheet and instruction to access the V-Safe system.   Robin Henderson was instructed to call 911 with any severe reactions post vaccine: Marland Kitchen Difficulty breathing  . Swelling of face and throat  . A fast heartbeat  . A bad rash all over body  . Dizziness and weakness   Immunizations Administered    Name Date Dose VIS Date Route   Moderna COVID-19 Vaccine 09/21/2019  2:03 PM 0.5 mL 06/06/2019 Intramuscular   Manufacturer: Moderna   Lot: [id number]   [id number]

## 2019-10-24 ENCOUNTER — Ambulatory Visit: Payer: Self-pay | Attending: Family

## 2019-10-24 DIAGNOSIS — Z23 Encounter for immunization: Secondary | ICD-10-CM

## 2019-10-24 NOTE — Progress Notes (Signed)
   Covid-19 Vaccination Clinic  Name:  Robin Henderson    MRN: [id number] DOB: [birth year]  10/24/2019  Robin Henderson was observed post Covid-19 immunization for 15 minutes without incident. She was provided with Vaccine Information Sheet and instruction to access the V-Safe system.   Robin Henderson was instructed to call 911 with any severe reactions post vaccine: Marland Kitchen Difficulty breathing  . Swelling of face and throat  . A fast heartbeat  . A bad rash all over body  . Dizziness and weakness   Immunizations Administered    Name Date Dose VIS Date Route   Moderna COVID-19 Vaccine 10/24/2019  1:52 PM 0.5 mL 06/2019 Intramuscular   Manufacturer: Moderna   Lot: [id number]   [id number]

## 2019-12-05 ENCOUNTER — Other Ambulatory Visit: Payer: Self-pay | Admitting: Obstetrics and Gynecology

## 2019-12-05 DIAGNOSIS — Z1231 Encounter for screening mammogram for malignant neoplasm of breast: Secondary | ICD-10-CM

## 2019-12-27 ENCOUNTER — Other Ambulatory Visit: Payer: Self-pay

## 2019-12-27 ENCOUNTER — Ambulatory Visit
Admission: RE | Admit: 2019-12-27 | Discharge: 2019-12-27 | Disposition: A | Discharge status: Home / Self Care | Payer: 59 | Source: Ambulatory Visit | Attending: Obstetrics and Gynecology | Admitting: Obstetrics and Gynecology

## 2019-12-27 DIAGNOSIS — Z1231 Encounter for screening mammogram for malignant neoplasm of breast: Secondary | ICD-10-CM

## 2020-01-03 ENCOUNTER — Other Ambulatory Visit: Payer: Self-pay | Admitting: Obstetrics and Gynecology

## 2020-01-03 DIAGNOSIS — R928 Other abnormal and inconclusive findings on diagnostic imaging of breast: Secondary | ICD-10-CM

## 2020-01-15 ENCOUNTER — Ambulatory Visit
Admission: RE | Admit: 2020-01-15 | Discharge: 2020-01-15 | Disposition: A | Discharge status: Home / Self Care | Payer: 59 | Source: Ambulatory Visit | Attending: Obstetrics and Gynecology | Admitting: Obstetrics and Gynecology

## 2020-01-15 ENCOUNTER — Other Ambulatory Visit: Payer: Self-pay | Admitting: Obstetrics and Gynecology

## 2020-01-15 ENCOUNTER — Other Ambulatory Visit: Payer: Self-pay

## 2020-01-15 DIAGNOSIS — R928 Other abnormal and inconclusive findings on diagnostic imaging of breast: Secondary | ICD-10-CM

## 2020-01-16 NOTE — Progress Notes (Signed)
Thanks

## 2020-01-16 NOTE — Progress Notes (Signed)
This patient has close follow up recommendations. Do we need to do anything or is the recall letter in the hands of the imaging center?  1. Left breast ultrasound in 3 months to reassess the probably benign area of presumed fat necrosis in the lower inner quadrant. 2. Right breast ultrasound in 6 months to reassess the probably benign mass at 9 o'clock.

## 2020-04-17 ENCOUNTER — Other Ambulatory Visit: Payer: 59

## 2020-07-18 ENCOUNTER — Other Ambulatory Visit: Payer: Self-pay | Admitting: Obstetrics and Gynecology

## 2020-07-18 ENCOUNTER — Ambulatory Visit
Admission: RE | Admit: 2020-07-18 | Discharge: 2020-07-18 | Disposition: A | Discharge status: Home / Self Care | Payer: 59 | Source: Ambulatory Visit | Attending: Obstetrics and Gynecology | Admitting: Obstetrics and Gynecology

## 2020-07-18 ENCOUNTER — Other Ambulatory Visit: Payer: Self-pay

## 2020-07-18 DIAGNOSIS — R928 Other abnormal and inconclusive findings on diagnostic imaging of breast: Secondary | ICD-10-CM

## 2020-11-14 ENCOUNTER — Encounter: Payer: Self-pay | Admitting: Internal Medicine

## 2020-12-30 ENCOUNTER — Other Ambulatory Visit: Payer: 59

## 2021-02-03 DIAGNOSIS — D214 Benign neoplasm of connective and other soft tissue of abdomen: Secondary | ICD-10-CM

## 2021-02-03 HISTORY — DX: Benign neoplasm of connective and other soft tissue of abdomen: D21.4

## 2021-02-03 HISTORY — PX: COLONOSCOPY: SHX174

## 2021-02-07 ENCOUNTER — Ambulatory Visit (AMBULATORY_SURGERY_CENTER): Payer: Self-pay

## 2021-02-07 ENCOUNTER — Other Ambulatory Visit: Payer: Self-pay

## 2021-02-07 VITALS — Ht 65.0 in | Wt 116.0 lb

## 2021-02-07 DIAGNOSIS — Z1211 Encounter for screening for malignant neoplasm of colon: Secondary | ICD-10-CM

## 2021-02-07 NOTE — Progress Notes (Signed)
No egg or soy allergy known to patient  No issues with past sedation with any surgeries or procedures Patient denies ever being told they had issues or difficulty with intubation  No FH of Malignant Hyperthermia No diet pills per patient No home 02 use per patient  No blood thinners per patient  Pt denies issues with constipation  No A fib or A flutter   COVID 19 guidelines implemented in PV today with Pt and RN  Pt is fully vaccinated for Covid   NO PA's for preps discussed with pt in PV today  Discussed with pt there will be an out-of-pocket cost for prep and that varies from $0 to 70 dollars   Due to the COVID-19 pandemic we are asking patients to follow certain guidelines.  Pt aware of COVID protocols and [facility] guidelines

## 2021-02-21 ENCOUNTER — Other Ambulatory Visit: Payer: Self-pay

## 2021-02-21 ENCOUNTER — Encounter: Payer: Self-pay | Admitting: Internal Medicine

## 2021-02-21 ENCOUNTER — Ambulatory Visit (AMBULATORY_SURGERY_CENTER): Payer: 59 | Admitting: Internal Medicine

## 2021-02-21 VITALS — BP 173/87 | HR 70 | Temp 99.8°F | Resp 13 | Ht 65.0 in | Wt 116.0 lb

## 2021-02-21 DIAGNOSIS — D214 Benign neoplasm of connective and other soft tissue of abdomen: Secondary | ICD-10-CM | POA: Diagnosis not present

## 2021-02-21 DIAGNOSIS — D125 Benign neoplasm of sigmoid colon: Secondary | ICD-10-CM

## 2021-02-21 DIAGNOSIS — K635 Polyp of colon: Secondary | ICD-10-CM | POA: Diagnosis not present

## 2021-02-21 DIAGNOSIS — Z1211 Encounter for screening for malignant neoplasm of colon: Secondary | ICD-10-CM | POA: Diagnosis present

## 2021-02-21 DIAGNOSIS — R030 Elevated blood-pressure reading, without diagnosis of hypertension: Secondary | ICD-10-CM

## 2021-02-21 MED ORDER — SODIUM CHLORIDE 0.9 % IV SOLN: 500.0000 mL | Freq: Once | INTRAVENOUS | Status: DC

## 2021-02-21 NOTE — Op Note (Signed)
[facility] Patient Name: Robin Henderson Procedure Date: 02/21/2021 10:31 AM MRN: [id number] Endoscopist: Gatha Mayer , MD Age: 55 Referring MD:  Date of Birth: [birth year] Gender: Female Account #: [id number] Procedure:                Colonoscopy Indications:              Screening for colorectal malignant neoplasm, This                            is the patient's first colonoscopy Medicines:                Propofol per Anesthesia, Monitored Anesthesia Care Procedure:                Pre-Anesthesia Assessment:                           - Prior to the procedure, a History and Physical                            was performed, and patient medications and                            allergies were reviewed. The patient's tolerance of                            previous anesthesia was also reviewed. The risks                            and benefits of the procedure and the sedation                            options and risks were discussed with the patient.                            All questions were answered, and informed consent                            was obtained. Prior Anticoagulants: The patient has                            taken no previous anticoagulant or antiplatelet                            agents. ASA Grade Assessment: III - A patient with                            severe systemic disease. After reviewing the risks                            and benefits, the patient was deemed in                            satisfactory condition to undergo the procedure.  After obtaining informed consent, the colonoscope                            was passed under direct vision. Throughout the                            procedure, the patient's blood pressure, pulse, and                            oxygen saturations were monitored continuously. The                            Olympus CF-HQ190L [id number]) Colonoscope was                             introduced through the anus and advanced to the the                            cecum, identified by appendiceal orifice and                            ileocecal valve. The colonoscopy was performed                            without difficulty. The patient tolerated the                            procedure well. The quality of the bowel                            preparation was excellent. The ileocecal valve,                            appendiceal orifice, and rectum were photographed.                            The bowel preparation used was Miralax via split                            dose instruction. Scope In: 10:53:25 AM Scope Out: 11:04:43 AM Scope Withdrawal Time: 0 hours 6 minutes 17 seconds  Total Procedure Duration: 0 hours 11 minutes 18 seconds  Findings:                 The perianal and digital rectal examinations were                            normal.                           A 5 mm polyp was found in the sigmoid colon. The                            polyp was sessile. The polyp was removed with a  cold snare. Resection and retrieval were complete.                            Verification of patient identification for the                            specimen was done. Estimated blood loss was minimal.                           Multiple small-mouthed diverticula were found in                            the sigmoid colon.                           The exam was otherwise without abnormality on                            direct and retroflexion views. Complications:            No immediate complications. Estimated Blood Loss:     Estimated blood loss was minimal. Impression:               - One 5 mm polyp in the sigmoid colon, removed with                            a cold snare. Resected and retrieved.                           - Diverticulosis in the sigmoid colon.                           - The examination was otherwise normal on direct                             and retroflexion views. there was some mild scope                            trauma (linear mucosal disruption with slight heme)                            in transverse, ascending and cecum. Recommendation:           - Patient has a contact number available for                            emergencies. The signs and symptoms of potential                            delayed complications were discussed with the                            patient. Return to normal activities tomorrow.  Written discharge instructions were provided to the                            patient.                           - Resume previous diet.                           - Continue present medications.                           - Await pathology results.                           - Repeat colonoscopy is recommended. The                            colonoscopy date will be determined after pathology                            results from today's exam become available for                            review.                           - Hasd elevated BP treated with labetolol                           Chart review shows mild elevations in past                           have reciommended she seek primary care evaluation                            of BP and perform home BP checks Gatha Mayer, MD 02/21/2021 11:16:45 AM This report has been signed electronically.

## 2021-02-21 NOTE — Progress Notes (Signed)
Report given to PACU, vss 

## 2021-02-21 NOTE — Progress Notes (Signed)
Vs by Opal.  Previsit with bg.

## 2021-02-21 NOTE — Patient Instructions (Addendum)
I found and removed one tiny polyp that looks benign. You also have a condition called diverticulosis - common and not usually a problem. Please read the handout provided.   I will let you know pathology results and when to have another routine colonoscopy by mail and/or My Chart.   Blood pressure was elevated - could be anxiety about the procedure but you should have this followed up - if you do not have a blood pressure cuff you can get one at a drugstore.  I appreciate the opportunity to care for you. Gatha Mayer, MD, [facility]   Handout on polyps, diverticulosis given.  YOU HAD AN ENDOSCOPIC PROCEDURE TODAY AT [facility]:   Refer to the procedure report that was given to you for any specific questions about what was found during the examination.  If the procedure report does not answer your questions, please call your gastroenterologist to clarify.  If you requested that your care partner not be given the details of your procedure findings, then the procedure report has been included in a sealed envelope for you to review at your convenience later.  YOU SHOULD EXPECT: Some feelings of bloating in the abdomen. Passage of more gas than usual.  Walking can help get rid of the air that was put into your GI tract during the procedure and reduce the bloating. If you had a lower endoscopy (such as a colonoscopy or flexible sigmoidoscopy) you may notice spotting of blood in your stool or on the toilet paper. If you underwent a bowel prep for your procedure, you may not have a normal bowel movement for a few days.  Please Note:  You might notice some irritation and congestion in your nose or some drainage.  This is from the oxygen used during your procedure.  There is no need for concern and it should clear up in a day or so.  SYMPTOMS TO REPORT IMMEDIATELY:  Following lower endoscopy (colonoscopy or flexible sigmoidoscopy):  Excessive amounts of blood in the stool  Significant  tenderness or worsening of abdominal pains  Swelling of the abdomen that is new, acute  Fever of 100F or higher  For urgent or emergent issues, a gastroenterologist can be reached at any hour by calling [phone]. Do not use MyChart messaging for urgent concerns.    DIET:  We do recommend a small meal at first, but then you may proceed to your regular diet.  Drink plenty of fluids but you should avoid alcoholic beverages for 24 hours.  ACTIVITY:  You should plan to take it easy for the rest of today and you should NOT DRIVE or use heavy machinery until tomorrow (because of the sedation medicines used during the test).    FOLLOW UP: Our staff will call the number listed on your records 48-72 hours following your procedure to check on you and address any questions or concerns that you may have regarding the information given to you following your procedure. If we do not reach you, we will leave a message.  We will attempt to reach you two times.  During this call, we will ask if you have developed any symptoms of COVID 19. If you develop any symptoms (ie: fever, flu-like symptoms, shortness of breath, cough etc.) before then, please call [phone].  If you test positive for Covid 19 in the 2 weeks post procedure, please call and report this information to [place].    If any biopsies were taken you will be  contacted by phone or by letter within the next 1-3 weeks.  Please call us at [phone] if you have not heard about the biopsies in 3 weeks.    SIGNATURES/CONFIDENTIALITY: You and/or your care partner have signed paperwork which will be entered into your electronic medical record.  These signatures attest to the fact that that the information above on your After Visit Summary has been reviewed and is understood.  Full responsibility of the confidentiality of this discharge information lies with you and/or your care-partner.

## 2021-02-21 NOTE — Progress Notes (Signed)
Called to room to assist during endoscopic procedure.  Patient ID and intended procedure confirmed with present staff. Received instructions for my participation in the procedure from the performing physician.  

## 2021-02-21 NOTE — Progress Notes (Signed)
[facility] History and Physical   Primary Care Physician:  Patient, No Pcp Per (Inactive)   Reason for Procedure:   Colon cancer screening  Plan:    colonoscopy     HPI: Robin Henderson is a 55 y.o. female here for screening colonoscopy   Past Medical History:  Diagnosis Date   Alcohol abuse    Anxiety and depression    Epilepsy ([place])    grand mal seizures < 1 minute-LAST SEIZURE (12/14/20214)   Hemorrhoids    Suicide attempt ([place])    x 2.  Almost slit wrists and almost jumped off a parking deck    Past Surgical History:  Procedure Laterality Date   EXTERNAL EAR SURGERY  1995   correct earring hole   HEAD & NECK SKIN LESION EXCISIONAL BIOPSY  1995   lip lesion   WISDOM TOOTH EXTRACTION      Prior to Admission medications   Not on File    No current outpatient medications on file.   Current Facility-Administered Medications  Medication Dose Route Frequency Provider Last Rate Last Admin   0.9 %  sodium chloride infusion  500 mL Intravenous Once Gatha Mayer, MD        Allergies as of 02/21/2021   (No Known Allergies)    Family History  Problem Relation Age of Onset   Heart disease Mother    Alcohol abuse Mother    Epilepsy Sister    Depression Sister        possible suicide attempt   Epilepsy Brother    Depression Brother        with suicide attempts   Alcohol abuse Maternal Grandfather    Colon polyps Neg Hx    Colon cancer Neg Hx    Esophageal cancer Neg Hx    Rectal cancer Neg Hx    Stomach cancer Neg Hx     Social History   Socioeconomic History   Marital status: Single    Spouse name: Not on file   Number of children: Not on file   Years of education: Not on file   Highest education level: Not on file  Occupational History   Not on file  Tobacco Use   Smoking status: Never   Smokeless tobacco: Never  Vaping Use   Vaping Use: Never used  Substance and Sexual Activity   Alcohol use: Yes    Alcohol/week: 1.0  standard drink    Types: 1 Standard drinks or equivalent per week   Drug use: No   Sexual activity: Yes    Comment: N/A insurance questions  Other Topics Concern   Not on file  Social History Narrative   Works.  Moving to new apartment to be nearer her boyfriend.     Social Determinants of Health   Financial Resource Strain: Not on file  Food Insecurity: Not on file  Transportation Needs: Not on file  Physical Activity: Not on file  Stress: Not on file  Social Connections: Not on file  Intimate Partner Violence: Not on file    Review of Systems:  All other review of systems negative except as mentioned in the HPI.  Physical Exam: Vital signs BP (!) 217/111   Pulse 76   Temp 99.8 F (37.7 C)   Resp 18   Ht '5\' 5"'$  (1.651 m)   Wt 116 lb (52.6 kg)   SpO2 99%   BMI 19.30 kg/m   General:   Alert,  Well-developed, well-nourished, pleasant and cooperative  in NAD Lungs:  Clear throughout to auscultation.   Heart:  Regular rate and rhythm; no murmurs, clicks, rubs,  or gallops. Abdomen:  Soft, nontender and nondistended. Normal bowel sounds.   Neuro/Psych:  Alert and cooperative. Normal mood and affect. A and O x 3   '@Robin Henderson'$  Simonne Maffucci, MD, [facility] [phone] (pager) 02/21/2021 10:51 AM@

## 2021-02-21 NOTE — Progress Notes (Signed)
1055 BP  202/117, Labetalol given IV, MD update, vss

## 2021-02-25 ENCOUNTER — Telehealth: Payer: Self-pay

## 2021-02-25 NOTE — Telephone Encounter (Signed)
  Follow up Call-  Call back number 02/21/2021  Post procedure Call Back phone  # [phone]  Permission to leave phone message Yes  Some recent data might be hidden     Patient questions:  Do you have a fever, pain , or abdominal swelling? No. Pain Score  0 *  Have you tolerated food without any problems? Yes.    Have you been able to return to your normal activities? Yes.    Do you have any questions about your discharge instructions: Diet   No. Medications  No. Follow up visit  No.  Do you have questions or concerns about your Care? No.  Actions: * If pain score is 4 or above: No action needed, pain <4.

## 2021-02-28 ENCOUNTER — Ambulatory Visit (HOSPITAL_COMMUNITY)
Admission: EM | Admit: 2021-02-28 | Discharge: 2021-02-28 | Disposition: A | Discharge status: Home / Self Care | Payer: 59 | Attending: Student | Admitting: Student

## 2021-02-28 ENCOUNTER — Encounter (HOSPITAL_COMMUNITY): Payer: Self-pay | Admitting: Emergency Medicine

## 2021-02-28 DIAGNOSIS — I160 Hypertensive urgency: Secondary | ICD-10-CM | POA: Diagnosis not present

## 2021-02-28 MED ORDER — AMLODIPINE BESYLATE 5 MG PO TABS: 5.0000 mg | ORAL_TABLET | Freq: Every day | ORAL | 0 refills | Status: DC

## 2021-02-28 NOTE — ED Provider Notes (Signed)
[facility]    CSN: [id number] Arrival date & time: 02/28/21  1036      History   Chief Complaint Chief Complaint  Patient presents with   Hypertension    HPI Robin Henderson is a 55 y.o. female presenting with hypertensive urgency. Medical history alcohol abuse, epilepsy (last seizure 06/2013), hemorrhoids.  States that her blood pressure has not been monitored in about 3 years, she went to her colonoscopy 1 week ago where they noted it was 180/100s.  They recommended that she present to the urgent care to start blood pressure medication.  Patient does state that she has been eating more salty food lately, and has decreased exercise.  Her weight has been constant at 115 pounds.  She is adamant that she does not have headaches, vision changes, chest pain, shortness of breath, etc.  She does plan to establish care with a primary care in the next few months.  HPI  Past Medical History:  Diagnosis Date   Alcohol abuse    Anxiety and depression    Epilepsy ([place])    grand mal seizures < 1 minute-LAST SEIZURE (12/14/20214)   Hemorrhoids    Suicide attempt ([place])    x 2.  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   Depression 10/06/2012   Alcohol abuse, in remission 05/06/2012   Restless legs syndrome (RLS) 05/06/2012   Other sleep disturbances 05/06/2012   Generalized convulsive epilepsy without mention of intractable epilepsy  05/06/2012    Past Surgical History:  Procedure Laterality Date   EXTERNAL EAR SURGERY  1995   correct earring hole   HEAD & NECK SKIN LESION EXCISIONAL BIOPSY  1995   lip lesion   WISDOM TOOTH EXTRACTION      OB History     Gravida  0   Para      Term      Preterm      AB      Living         SAB      IAB      Ectopic      Multiple      Live Births               Home Medications    Prior to Admission medications   Medication Sig Start Date End Date Taking? Authorizing Provider  amLODipine (NORVASC) 5 MG tablet Take 1 tablet (5 mg total) by mouth daily. 02/28/21  Yes Hazel Sams, PA-C    Family History Family History  Problem Relation Age of Onset   Heart disease Mother    Alcohol abuse Mother    Epilepsy Sister    Depression Sister        possible suicide attempt   Epilepsy Brother    Depression Brother        with suicide attempts   Alcohol abuse Maternal Grandfather    Colon polyps Neg Hx  Colon cancer Neg Hx    Esophageal cancer Neg Hx    Rectal cancer Neg Hx    Stomach cancer Neg Hx     Social History Social History   Tobacco Use   Smoking status: Never   Smokeless tobacco: Never  Vaping Use   Vaping Use: Never used  Substance Use Topics   Alcohol use: Yes    Alcohol/week: 1.0 standard drink    Types: 1 Standard drinks or equivalent per week   Drug use: No     Allergies   Patient has no known allergies.   Review of Systems Review of Systems  All other systems reviewed and are negative.   Physical Exam Triage Vital Signs ED Triage Vitals  Enc Vitals Group     BP 02/28/21 1057 (!) 202/119     Pulse Rate 02/28/21 1057 84     Resp 02/28/21 1057 16     Temp 02/28/21 1057 98.2 F (36.8 C)     Temp Source 02/28/21 1057 Oral     SpO2 02/28/21 1057 98 %     Weight --      Height --      Head Circumference --      Peak Flow --      Pain Score 02/28/21 1056 0     Pain Loc --      Pain Edu? --      Excl. in  [place]? --    No data found.  Updated Vital Signs BP (!) 202/119 (BP Location: Left Arm)   Pulse 84   Temp 98.2 F (36.8 C) (Oral)   Resp 16   LMP 10/27/2016   SpO2 98%   Visual Acuity Right Eye Distance:   Left Eye Distance:   Bilateral Distance:    Right Eye Near:   Left Eye Near:    Bilateral Near:     Physical Exam Vitals reviewed.  Constitutional:      Appearance: Normal appearance. She is not diaphoretic.  HENT:     Head: Normocephalic and atraumatic.     Mouth/Throat:     Mouth: Mucous membranes are moist.  Eyes:     Extraocular Movements: Extraocular movements intact.     Pupils: Pupils are equal, round, and reactive to light.  Cardiovascular:     Rate and Rhythm: Normal rate and regular rhythm.     Pulses:          Radial pulses are 2+ on the right side and 2+ on the left side.     Heart sounds: Normal heart sounds.  Pulmonary:     Effort: Pulmonary effort is normal.     Breath sounds: Normal breath sounds.  Abdominal:     Palpations: Abdomen is soft.     Tenderness: There is no abdominal tenderness. There is no guarding or rebound.  Musculoskeletal:     Right lower leg: No edema.     Left lower leg: No edema.  Skin:    General: Skin is warm.     Capillary Refill: Capillary refill takes less than 2 seconds.  Neurological:     General: No focal deficit present.     Mental Status: She is alert and oriented to person, place, and time.  Psychiatric:        Mood and Affect: Mood normal.        Behavior: Behavior normal.        Thought Content: Thought content normal.  Judgment: Judgment normal.     UC Treatments / Results  Labs (all labs ordered are listed, but only abnormal results are displayed) Labs Reviewed - No data to display  EKG   Radiology No results found.  Procedures Procedures (including critical care time)  Medications Ordered in UC Medications - No data to display  Initial Impression / Assessment and Plan / UC Course   I have reviewed the triage vital signs and the nursing notes.  Pertinent labs & imaging results that were available during my care of the patient were reviewed by me and considered in my medical decision making (see chart for details).     This patient is a very pleasant 55 y.o. year old female presenting with hypertensive urgency- BP 202/119.  She adamantly denies headaches, vision changes, dizziness, shortness of breath, chest pain.  She states that the last time her blood pressure was checked was over 3 years ago, she does not have a primary care though she plans to establish with one soon.  Trial of amlodipine x30 days, DASH diet, increase exercise. Monitor BP at home. Follow-up with [place] or PCP for recheck in 30 days.  STRICT ED return precautions discussed. Patient verbalizes understanding and agreement.   Level 4 given new problem with uncertain prognosis (hypertensive urgency), prescription drug management.   Final Clinical Impressions(s) / UC Diagnoses   Final diagnoses:  Hypertensive urgency     Discharge Instructions      -I sent a prescription for amlodipine.  Start this at 1 pill daily. -Please check your blood pressure at home or at the pharmacy. If this continues to be >140/90, follow-up with your primary care provider for further blood pressure management/ medication titration. If you develop chest pain, shortness of breath, vision changes, the worst headache of your life- head straight to the ED or call 911. -Establish care as directed with your new primary care.   ED Prescriptions     Medication Sig Dispense Auth. Provider   amLODipine (NORVASC) 5 MG tablet Take 1 tablet (5 mg total) by mouth daily. 30 tablet Hazel Sams, PA-C      PDMP not reviewed this encounter.   Hazel Sams, PA-C 02/28/21 1125

## 2021-02-28 NOTE — ED Triage Notes (Signed)
Pt presents with hypertension xs 1 week. Denies headache, blurred vision, or dizziness.

## 2021-02-28 NOTE — Discharge Instructions (Addendum)
-  I sent a prescription for amlodipine.  Start this at 1 pill daily. -Please check your blood pressure at home or at the pharmacy. If this continues to be >140/90, follow-up with your primary care provider for further blood pressure management/ medication titration. If you develop chest pain, shortness of breath, vision changes, the worst headache of your life- head straight to the ED or call 911. -Establish care as directed with your new primary care.

## 2021-03-04 IMAGING — MG MM DIGITAL DIAGNOSTIC UNILAT*L* W/ TOMO W/ CAD
6 series · 6 of 18 positions shown · non-contrast
Comparison: Previous exam(s).

CLINICAL DATA: Screening recall for a possible mass in the right
breast. After performing her diagnostic exam of the right breast,
she reported a palpable lump in the lower inner aspect of the left
breast. Both the right and left breasts were evaluated with
diagnostic mammography and ultrasound.

EXAM:
DIGITAL DIAGNOSTIC BILATERAL MAMMOGRAM WITH CAD AND TOMO
ULTRASOUND BILATERAL BREAST

[L MLO synth-2D]
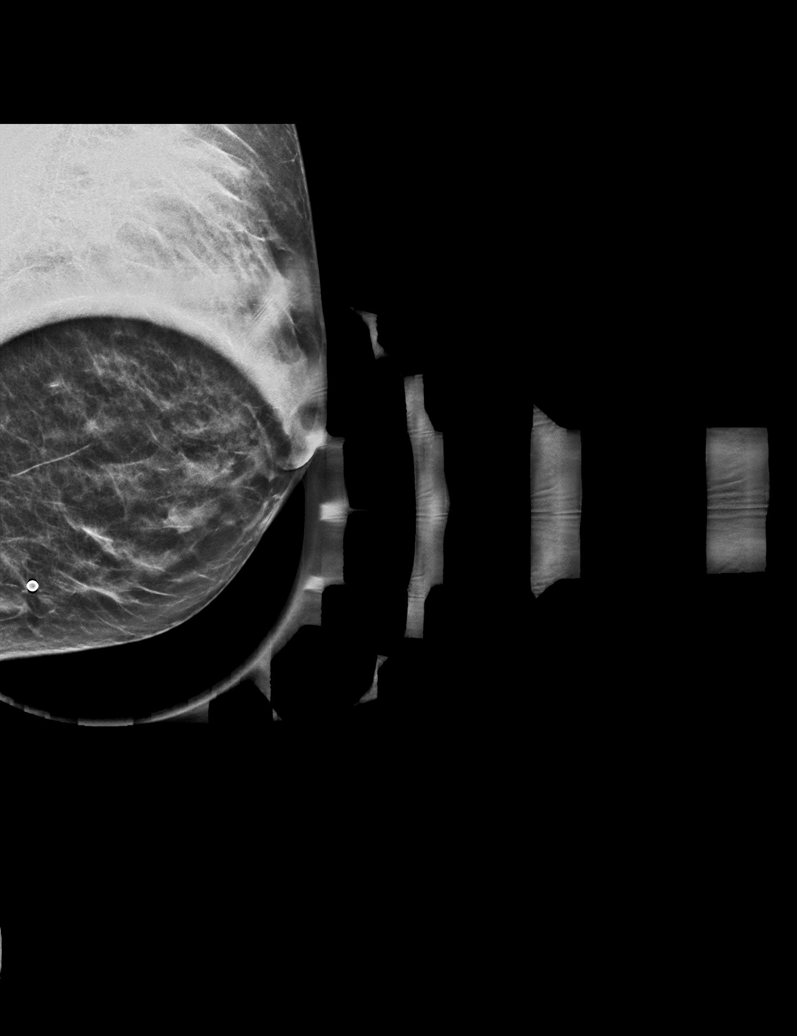

[L CC synth-2D]
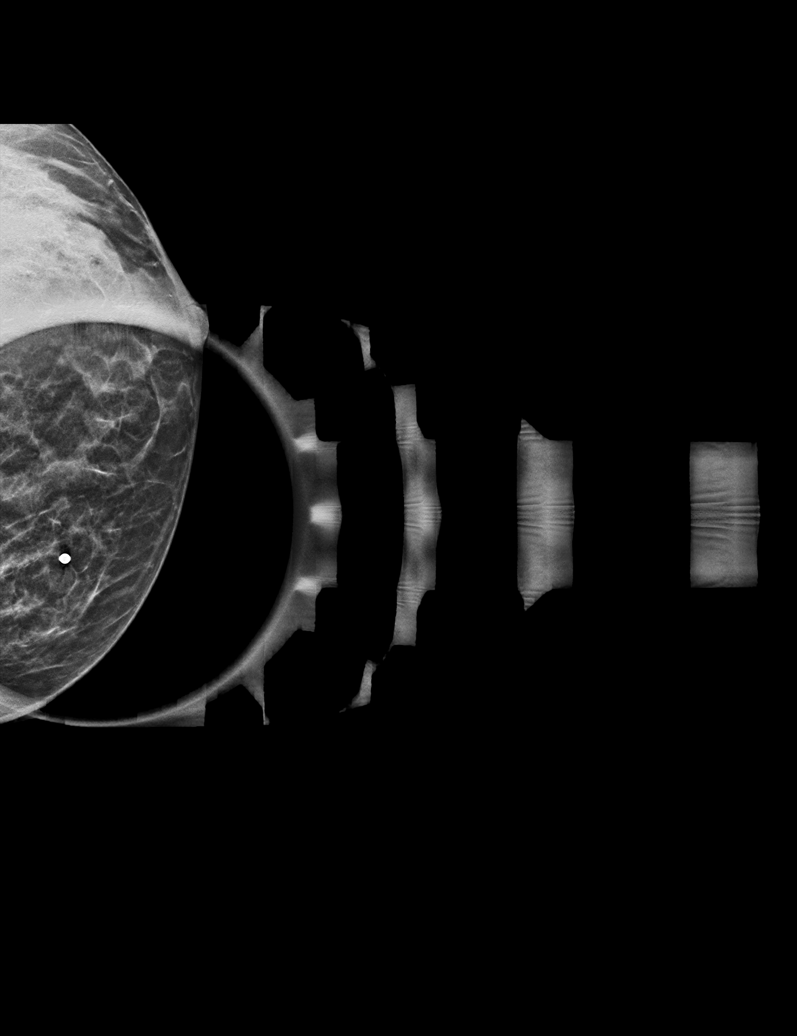

[L SIO synth-2D]
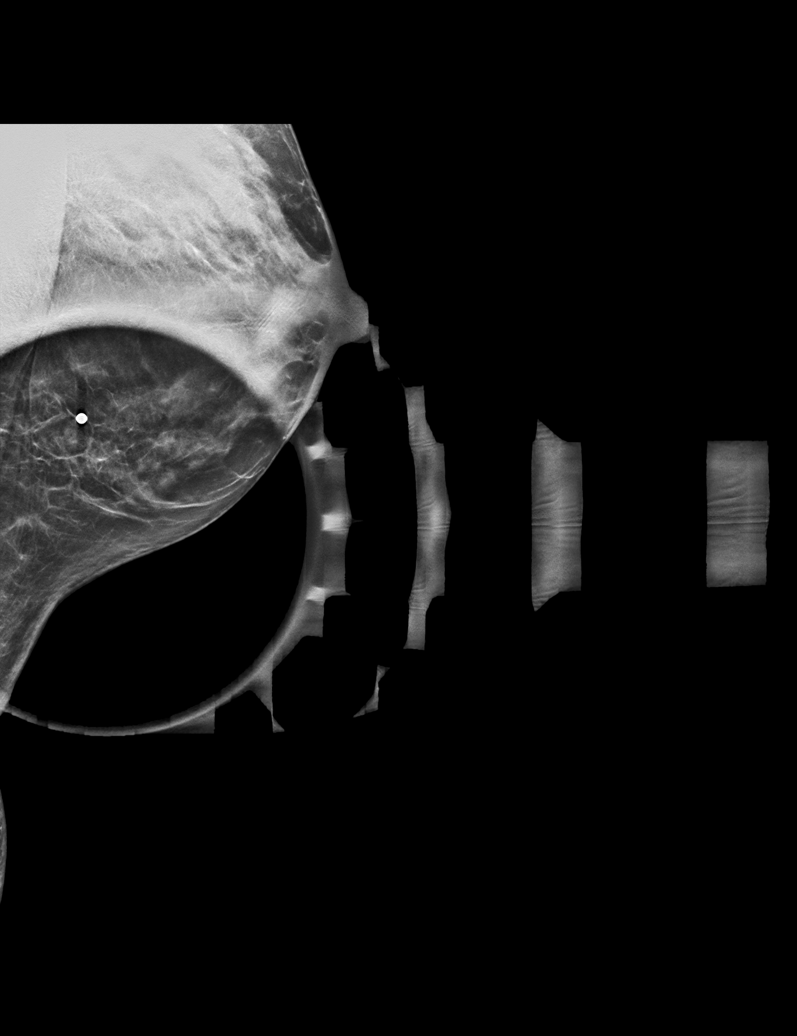

[L CC tomo · tomo slice 15/30.0]
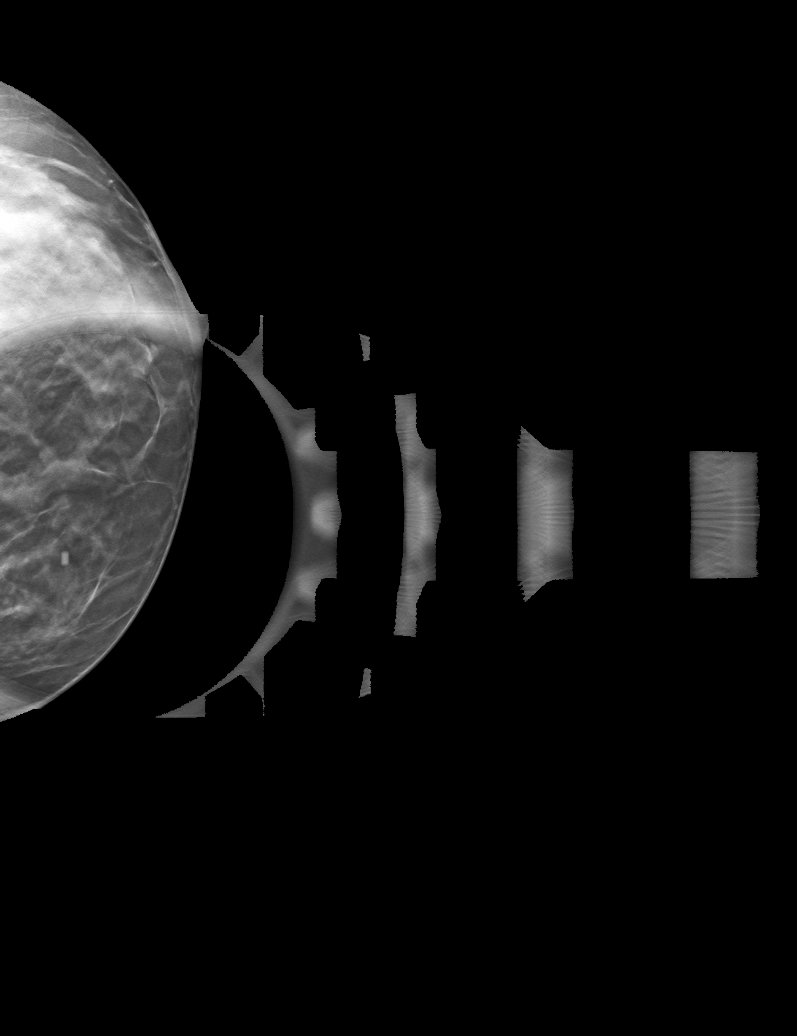

[L MLO tomo · tomo slice 20/39.0]
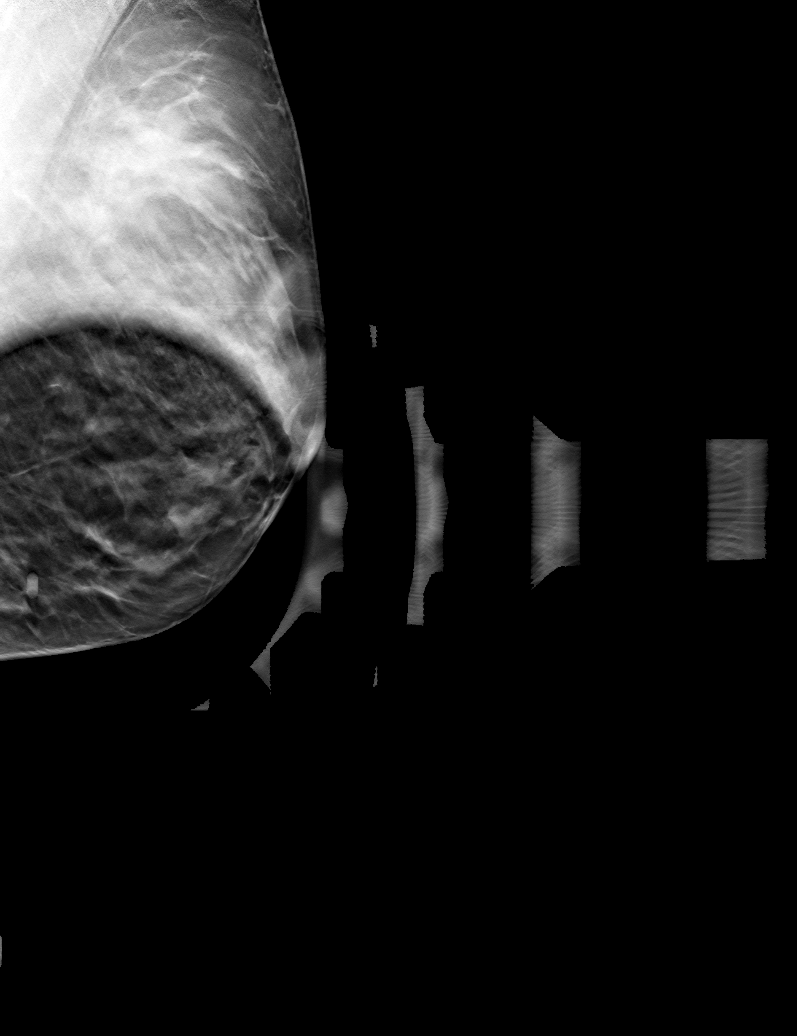

[L SIO tomo · tomo slice 19/38.0]
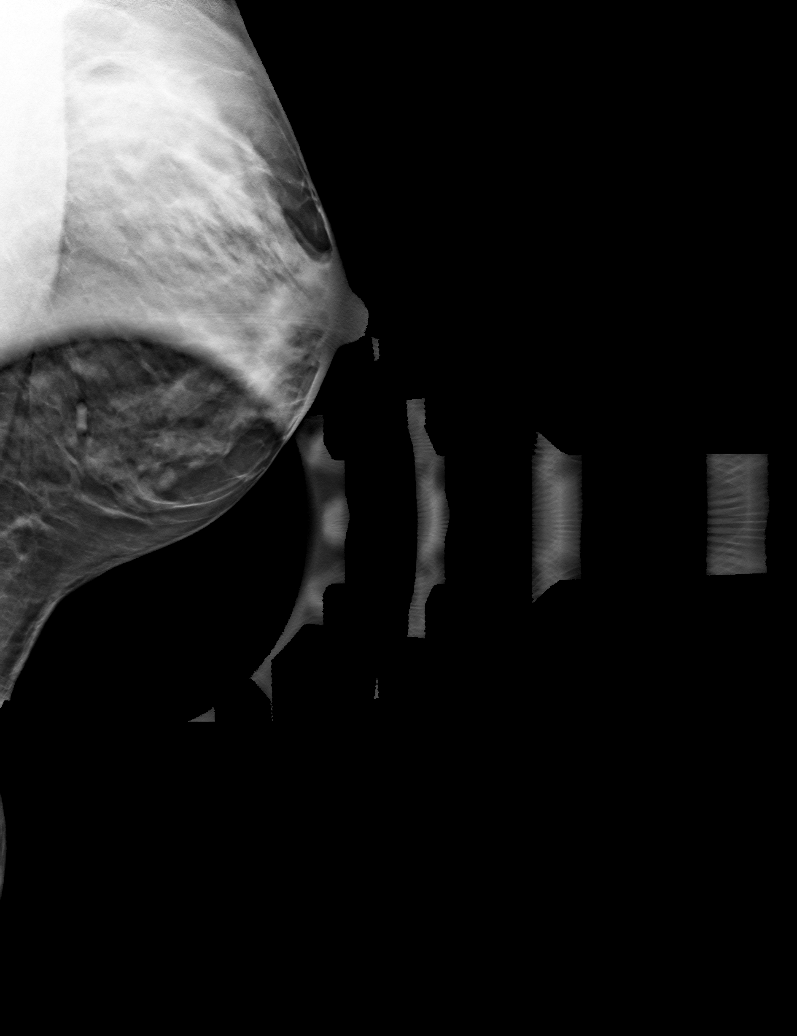

[6 of 18 positions shown; findings below may reference images not displayed]

ACR Breast Density Category c: The breast tissue is heterogeneously
dense, which may obscure small masses.
FINDINGS: In the right breast, the mass noted on the screening study persists
as an oval circumscribed, approximately 1.2 cm mass in the lateral
breast.

In the left breast, there is a mixed attenuation mass in the lower
inner quadrant corresponding to the palpable abnormality. It has a
central area of intermediate attenuation surrounded by fat
attenuation.

Mammographic images were processed with CAD.

On physical exam, there is a palpable small mass in the lower inner
quadrant of the left breast with an overlying skin ecchymosis.

Targeted right breast ultrasound is performed, showing an oval
circumscribed hypoechoic mass at 9 o'clock, 2 cm the nipple, with
internal blood flow on color Doppler analysis, measuring 1.4 x 0.6 x
0.9 cm, consistent in size, shape and location to the mammographic
mass and consistent with a fibroadenoma. Sonographic evaluation of
the right axilla shows no enlarged or abnormal lymph nodes.

Targeted ultrasound is performed, showing a hypoechoic mass in the
superficial left breast at 8 o'clock, 6 cm the nipple. The mass
measures 6 x 4 x 5 mm. There is adjacent hyperechoic tissue
consistent with inflammation. There is no internal blood flow on
color Doppler analysis. Sonographic evaluation of the left axilla
shows no enlarged or abnormal lymph nodes.
IMPRESSION: 1. Probably benign right breast mass, most likely a fibroadenoma.
Short-term follow-up recommended.
2. Probably benign mass in the lower inner quadrant of the left
breast, most likely an area of fat necrosis, corresponding to the
palpable abnormality. Short-term follow-up recommended.

RECOMMENDATION:
1. Left breast ultrasound in 3 months to reassess the probably
benign area of presumed fat necrosis in the lower inner quadrant.
2. Right breast ultrasound in 6 months to reassess the probably
benign mass at 9 o'clock.

I have discussed the findings and recommendations with the patient.
If applicable, a reminder letter will be sent to the patient
regarding the next appointment.

BI-RADS CATEGORY  3: Probably benign.

## 2021-03-04 IMAGING — MG MM DIGITAL DIAGNOSTIC UNILAT*R* W/ TOMO W/ CAD
4 series · 4 of 12 positions shown · non-contrast
Comparison: Previous exam(s).

CLINICAL DATA: Screening recall for a possible mass in the right
breast. After performing her diagnostic exam of the right breast,
she reported a palpable lump in the lower inner aspect of the left
breast. Both the right and left breasts were evaluated with
diagnostic mammography and ultrasound.

EXAM:
DIGITAL DIAGNOSTIC BILATERAL MAMMOGRAM WITH CAD AND TOMO
ULTRASOUND BILATERAL BREAST

[R CC synth-2D]
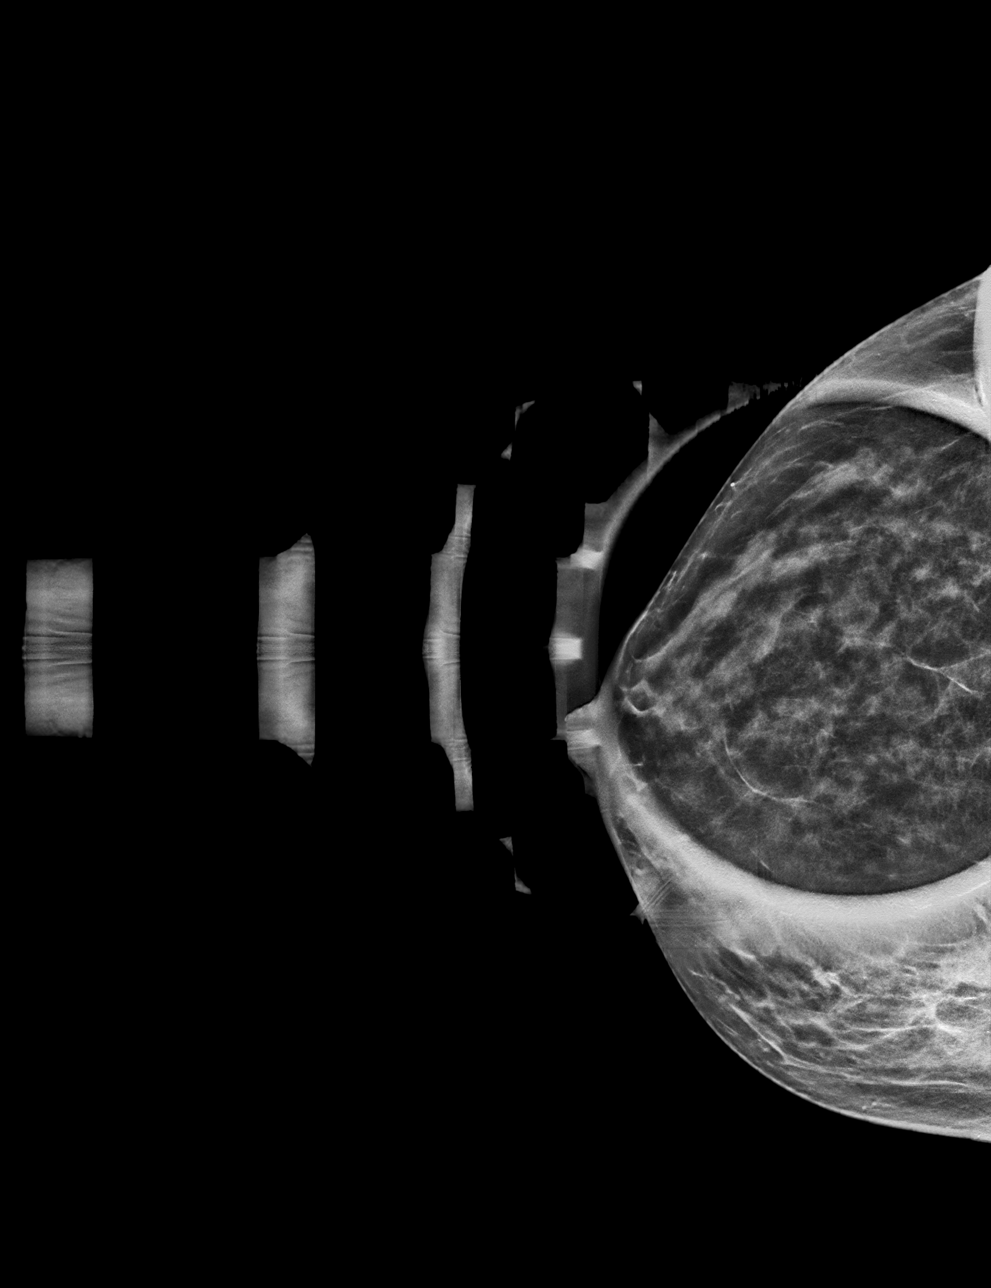

[R MLO synth-2D]
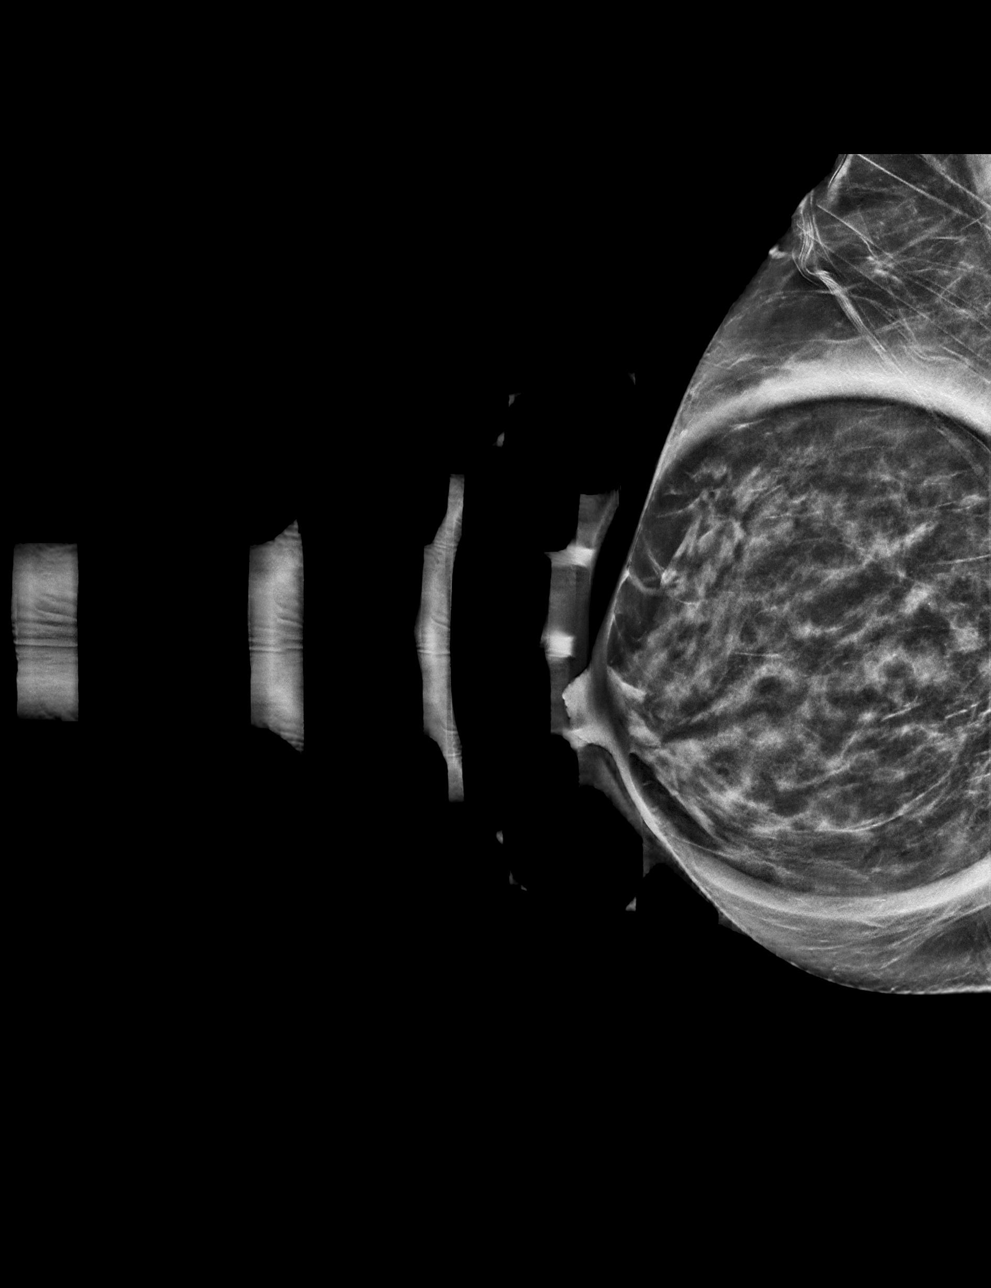

[R CC tomo · tomo slice 23/46.0]
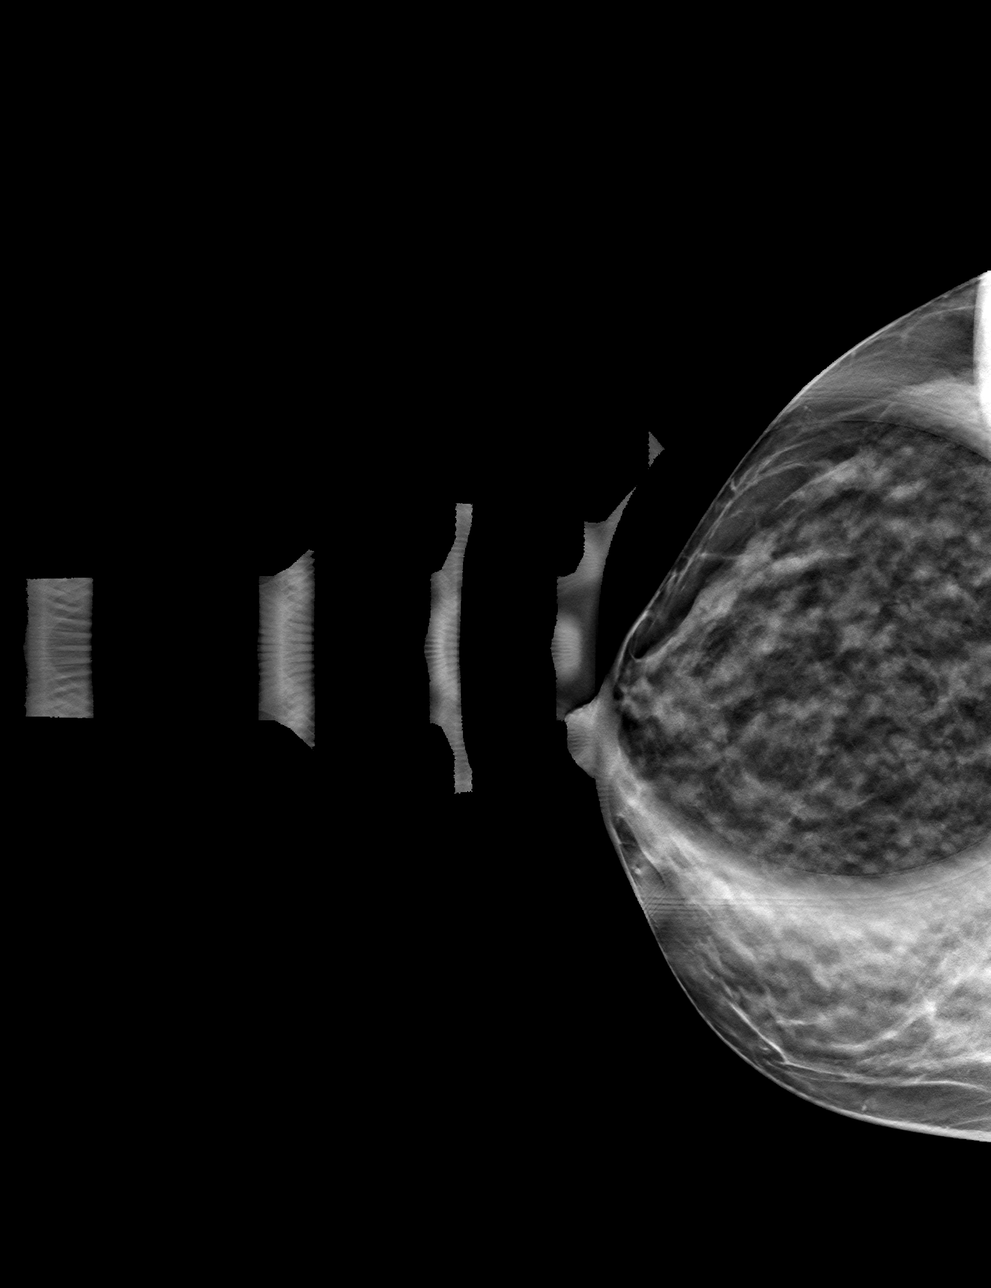

[R MLO tomo · tomo slice 27/53.0]
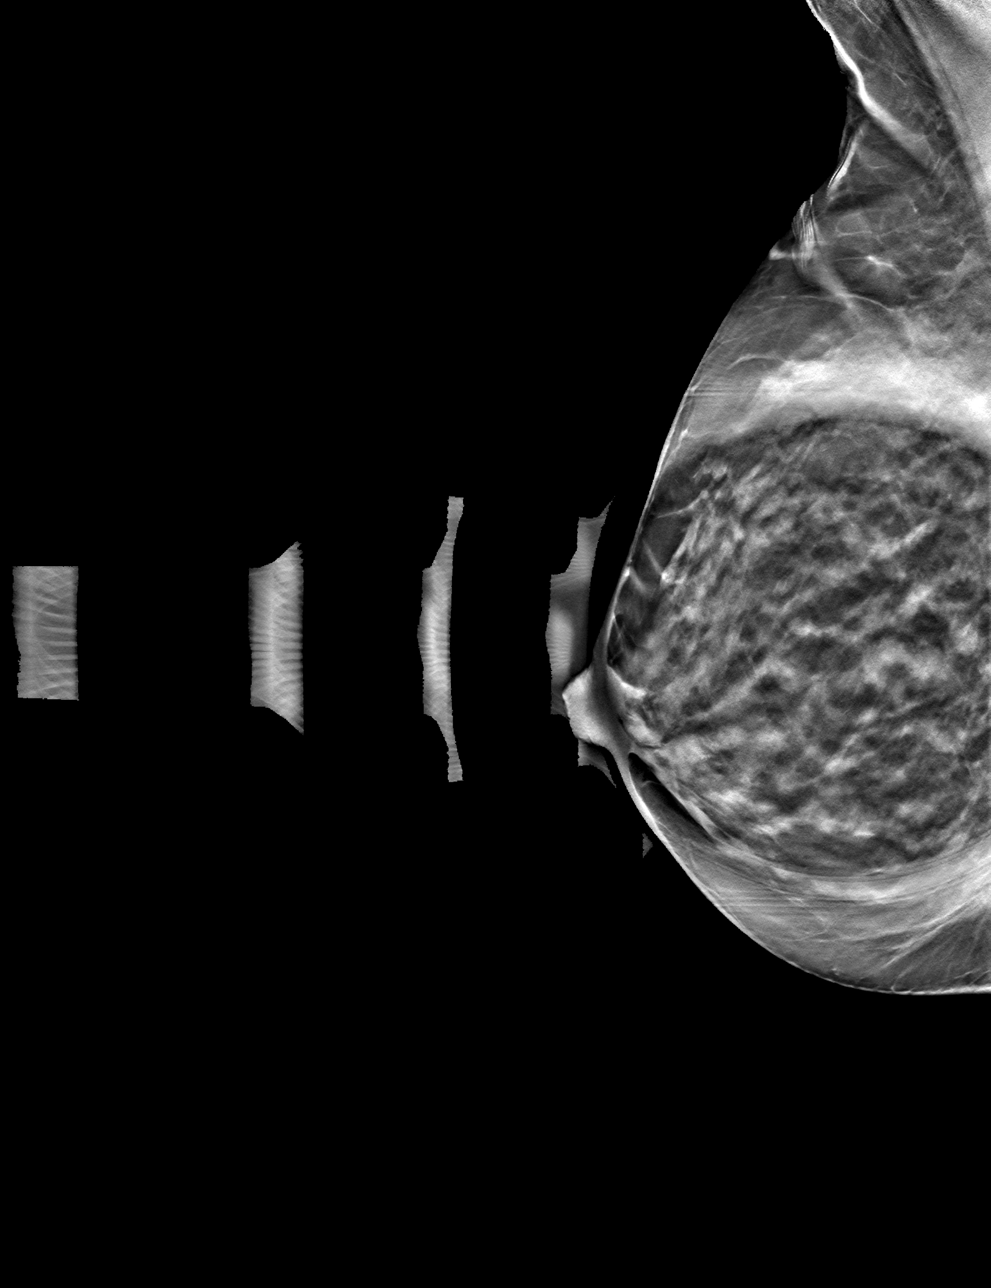

[4 of 12 positions shown; findings below may reference images not displayed]

ACR Breast Density Category c: The breast tissue is heterogeneously
dense, which may obscure small masses.
FINDINGS: In the right breast, the mass noted on the screening study persists
as an oval circumscribed, approximately 1.2 cm mass in the lateral
breast.

In the left breast, there is a mixed attenuation mass in the lower
inner quadrant corresponding to the palpable abnormality. It has a
central area of intermediate attenuation surrounded by fat
attenuation.

Mammographic images were processed with CAD.

On physical exam, there is a palpable small mass in the lower inner
quadrant of the left breast with an overlying skin ecchymosis.

Targeted right breast ultrasound is performed, showing an oval
circumscribed hypoechoic mass at 9 o'clock, 2 cm the nipple, with
internal blood flow on color Doppler analysis, measuring 1.4 x 0.6 x
0.9 cm, consistent in size, shape and location to the mammographic
mass and consistent with a fibroadenoma. Sonographic evaluation of
the right axilla shows no enlarged or abnormal lymph nodes.

Targeted ultrasound is performed, showing a hypoechoic mass in the
superficial left breast at 8 o'clock, 6 cm the nipple. The mass
measures 6 x 4 x 5 mm. There is adjacent hyperechoic tissue
consistent with inflammation. There is no internal blood flow on
color Doppler analysis. Sonographic evaluation of the left axilla
shows no enlarged or abnormal lymph nodes.
IMPRESSION: 1. Probably benign right breast mass, most likely a fibroadenoma.
Short-term follow-up recommended.
2. Probably benign mass in the lower inner quadrant of the left
breast, most likely an area of fat necrosis, corresponding to the
palpable abnormality. Short-term follow-up recommended.

RECOMMENDATION:
1. Left breast ultrasound in 3 months to reassess the probably
benign area of presumed fat necrosis in the lower inner quadrant.
2. Right breast ultrasound in 6 months to reassess the probably
benign mass at 9 o'clock.

I have discussed the findings and recommendations with the patient.
If applicable, a reminder letter will be sent to the patient
regarding the next appointment.

BI-RADS CATEGORY  3: Probably benign.

## 2021-03-04 IMAGING — US US BREAST*R* LIMITED INC AXILLA
1 series · 9 of 9 positions shown · non-contrast
Comparison: Previous exam(s).

CLINICAL DATA: Screening recall for a possible mass in the right
breast. After performing her diagnostic exam of the right breast,
she reported a palpable lump in the lower inner aspect of the left
breast. Both the right and left breasts were evaluated with
diagnostic mammography and ultrasound.

EXAM:
DIGITAL DIAGNOSTIC BILATERAL MAMMOGRAM WITH CAD AND TOMO
ULTRASOUND BILATERAL BREAST

[Series 1: us breast*right* limited inc axilla · 0.06mm/px · 9 of 9 slices shown]
[im 1/9]
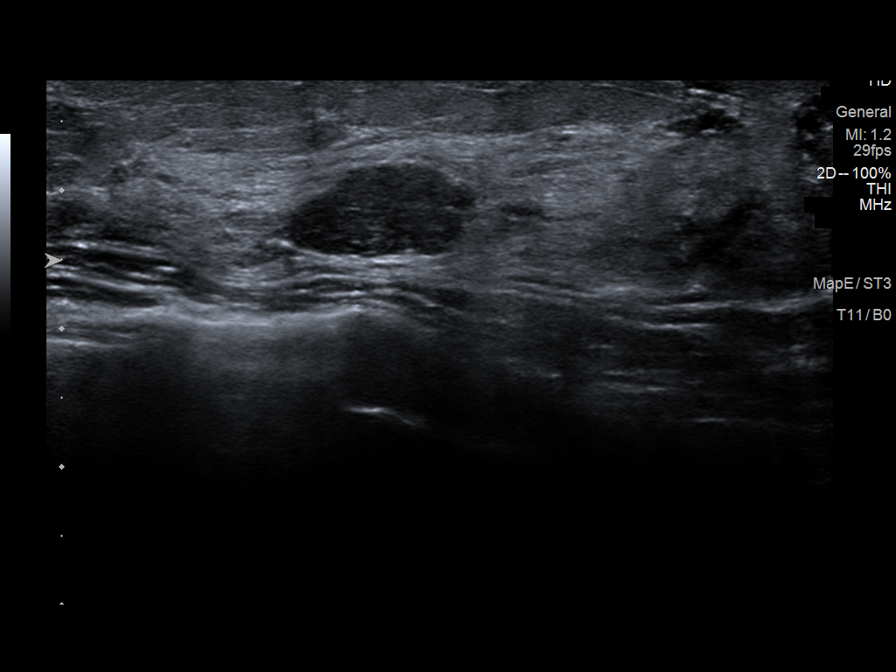
[im 2/9]
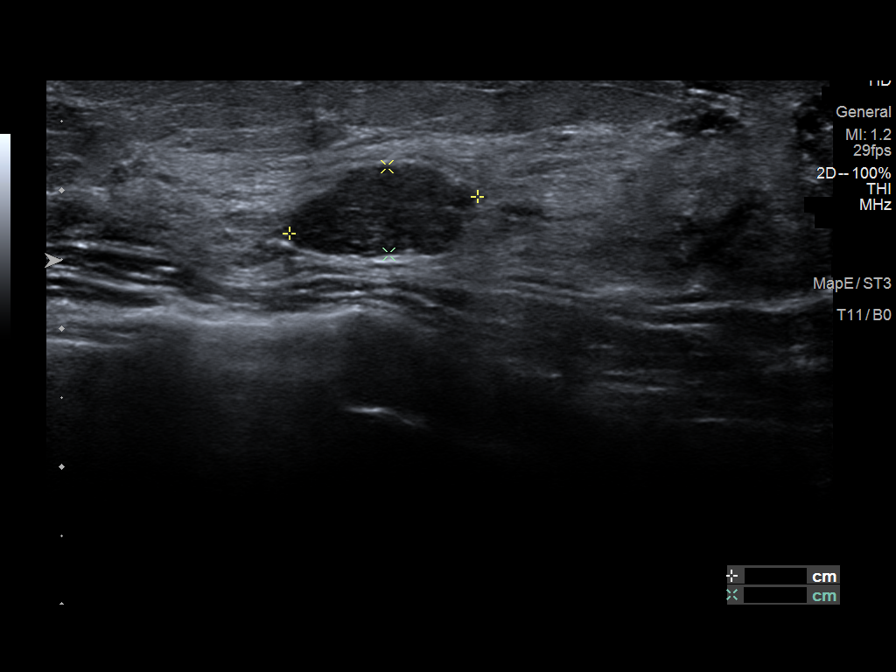
[im 3/9]
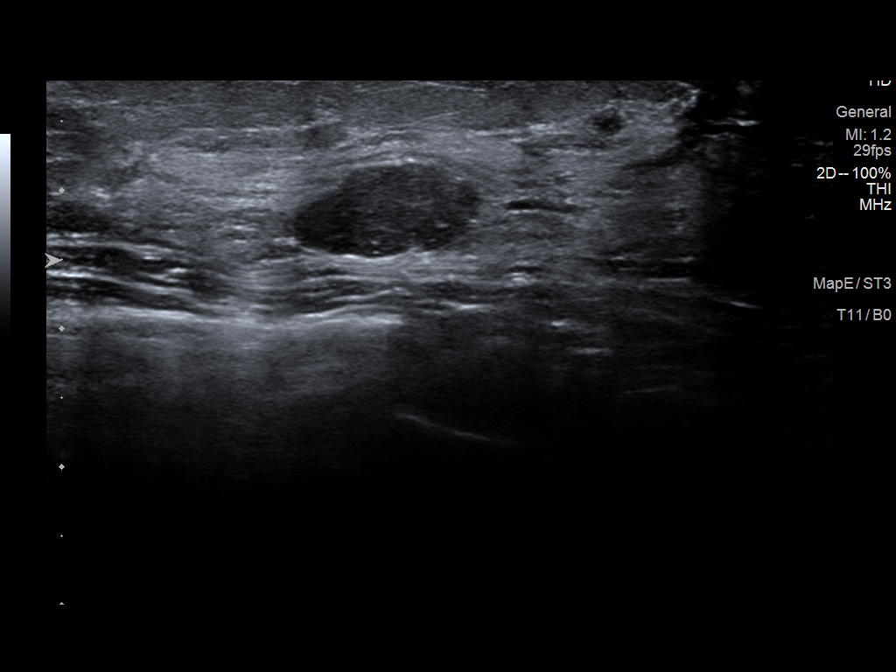
[im 4/9]
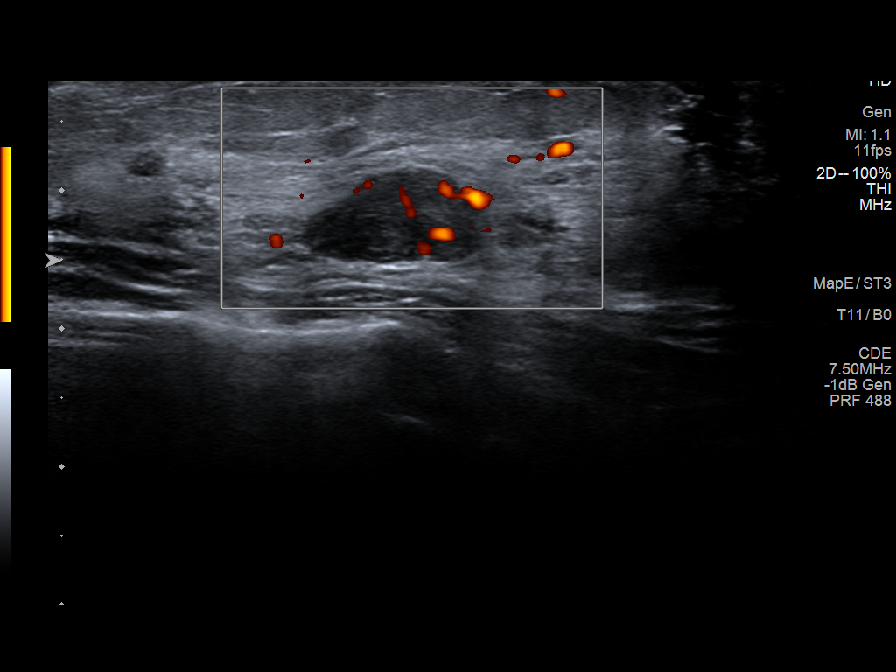
[im 5/9]
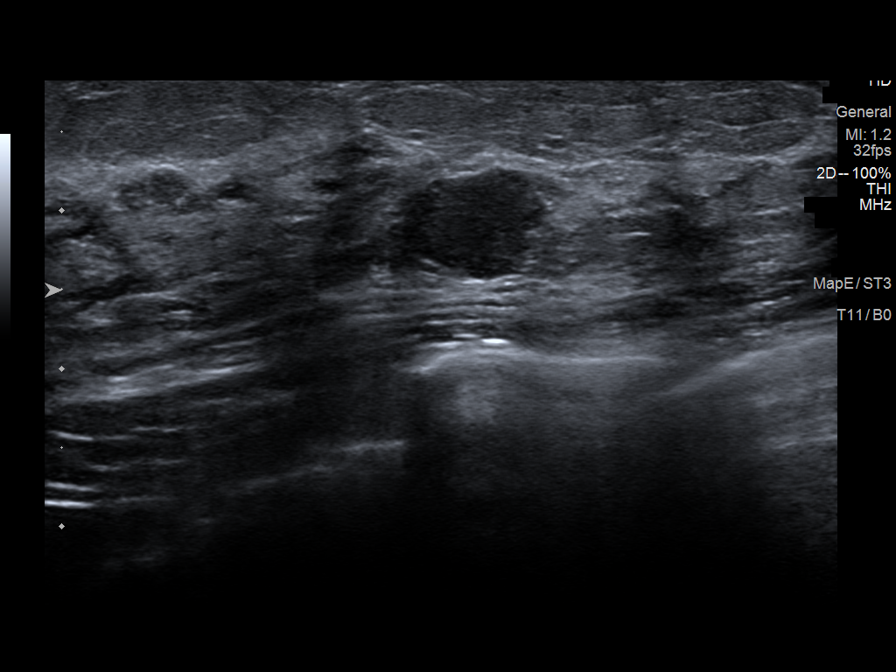
[im 6/9]
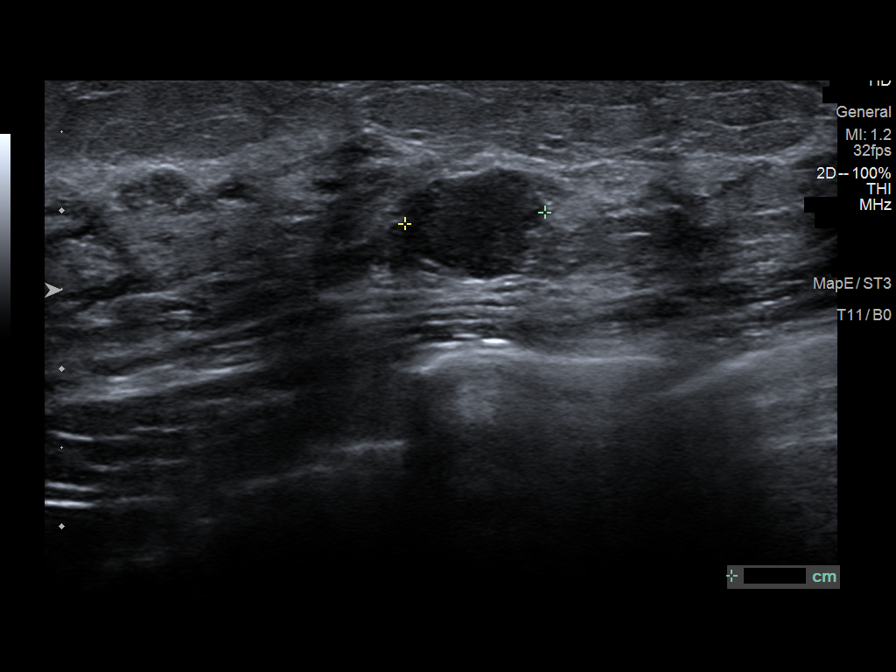
[im 7/9]
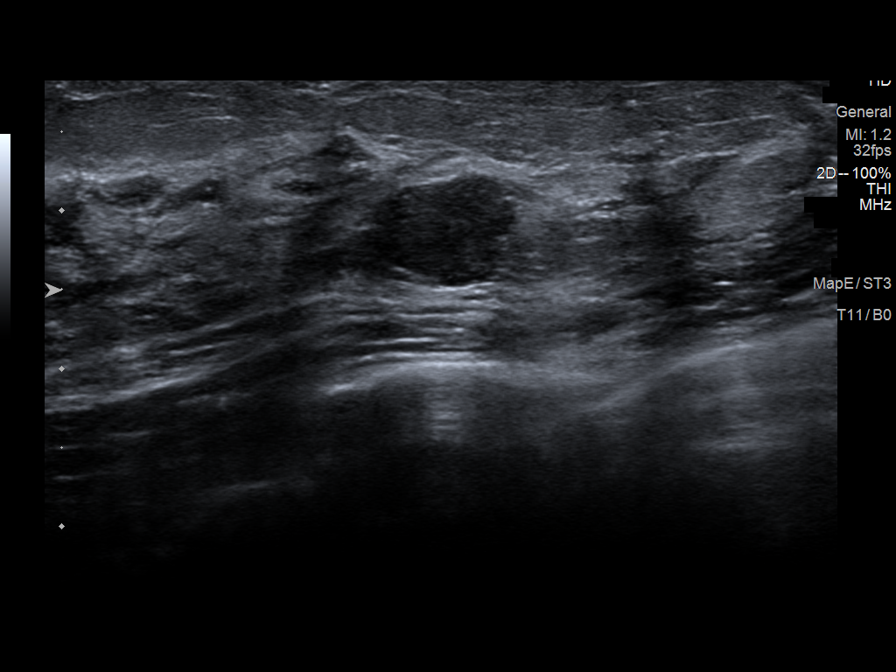
[im 8/9]
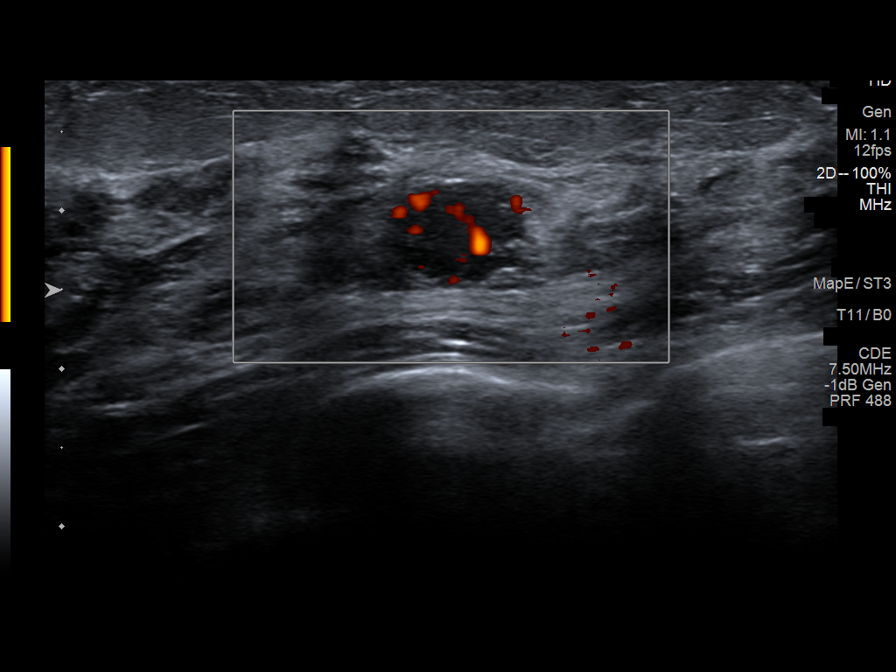
[im 9/9]
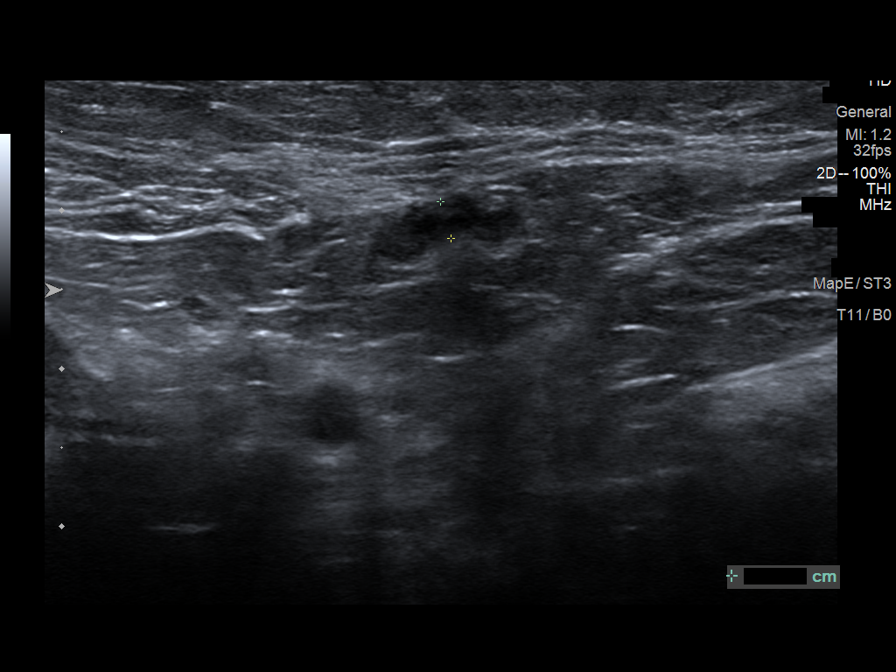

[9 of 9 positions shown; findings below may reference images not displayed]

ACR Breast Density Category c: The breast tissue is heterogeneously
dense, which may obscure small masses.
FINDINGS: In the right breast, the mass noted on the screening study persists
as an oval circumscribed, approximately 1.2 cm mass in the lateral
breast.

In the left breast, there is a mixed attenuation mass in the lower
inner quadrant corresponding to the palpable abnormality. It has a
central area of intermediate attenuation surrounded by fat
attenuation.

Mammographic images were processed with CAD.

On physical exam, there is a palpable small mass in the lower inner
quadrant of the left breast with an overlying skin ecchymosis.

Targeted right breast ultrasound is performed, showing an oval
circumscribed hypoechoic mass at 9 o'clock, 2 cm the nipple, with
internal blood flow on color Doppler analysis, measuring 1.4 x 0.6 x
0.9 cm, consistent in size, shape and location to the mammographic
mass and consistent with a fibroadenoma. Sonographic evaluation of
the right axilla shows no enlarged or abnormal lymph nodes.

Targeted ultrasound is performed, showing a hypoechoic mass in the
superficial left breast at 8 o'clock, 6 cm the nipple. The mass
measures 6 x 4 x 5 mm. There is adjacent hyperechoic tissue
consistent with inflammation. There is no internal blood flow on
color Doppler analysis. Sonographic evaluation of the left axilla
shows no enlarged or abnormal lymph nodes.
IMPRESSION: 1. Probably benign right breast mass, most likely a fibroadenoma.
Short-term follow-up recommended.
2. Probably benign mass in the lower inner quadrant of the left
breast, most likely an area of fat necrosis, corresponding to the
palpable abnormality. Short-term follow-up recommended.

RECOMMENDATION:
1. Left breast ultrasound in 3 months to reassess the probably
benign area of presumed fat necrosis in the lower inner quadrant.
2. Right breast ultrasound in 6 months to reassess the probably
benign mass at 9 o'clock.

I have discussed the findings and recommendations with the patient.
If applicable, a reminder letter will be sent to the patient
regarding the next appointment.

BI-RADS CATEGORY  3: Probably benign.

## 2021-03-05 ENCOUNTER — Encounter: Payer: Self-pay | Admitting: Internal Medicine

## 2021-03-07 ENCOUNTER — Ambulatory Visit
Admission: RE | Admit: 2021-03-07 | Discharge: 2021-03-07 | Disposition: A | Discharge status: Home / Self Care | Payer: 59 | Source: Ambulatory Visit | Attending: Obstetrics and Gynecology | Admitting: Obstetrics and Gynecology

## 2021-03-07 ENCOUNTER — Other Ambulatory Visit: Payer: Self-pay

## 2021-03-07 DIAGNOSIS — R928 Other abnormal and inconclusive findings on diagnostic imaging of breast: Secondary | ICD-10-CM

## 2021-03-27 ENCOUNTER — Other Ambulatory Visit: Payer: Self-pay

## 2021-03-27 ENCOUNTER — Encounter (HOSPITAL_COMMUNITY): Payer: Self-pay

## 2021-03-27 ENCOUNTER — Ambulatory Visit (HOSPITAL_COMMUNITY)
Admission: RE | Admit: 2021-03-27 | Discharge: 2021-03-27 | Disposition: A | Discharge status: Home / Self Care | Payer: 59 | Source: Ambulatory Visit | Attending: Emergency Medicine | Admitting: Emergency Medicine

## 2021-03-27 VITALS — BP 165/89 | HR 72 | Temp 98.7°F | Resp 18

## 2021-03-27 DIAGNOSIS — I10 Essential (primary) hypertension: Secondary | ICD-10-CM

## 2021-03-27 MED ORDER — AMLODIPINE BESY-BENAZEPRIL HCL 5-10 MG PO CAPS: 1.0000 | ORAL_CAPSULE | Freq: Every day | ORAL | 3 refills | Status: DC

## 2021-03-27 NOTE — ED Provider Notes (Signed)
[facility]    CSN: [id number] Arrival date & time: 03/27/21  1453      History   Chief Complaint Chief Complaint  Patient presents with   Appointment    1500   Hypertension    HPI Robin Henderson is a 55 y.o. female. Pt dx with HTN last month. Rx amlodipine. Has been checking BP at home; BP typically 140's/80's now on amlodipine. Reports BLE swelling since starting on amlodipine, relieved when she elevates BLE.     Hypertension Pertinent negatives include no chest pain, no headaches and no shortness of breath.   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   Depression 10/06/2012   Alcohol abuse, in remission 05/06/2012   Restless legs syndrome (RLS) 05/06/2012   Other sleep disturbances 05/06/2012   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 Authorizing Provider  amLODipine-benazepril (LOTREL) 5-10 MG capsule Take 1 capsule by mouth daily. 03/27/21  Yes Carvel Getting, NP    Family History Family History  Problem Relation Age of Onset   Heart disease Mother    Alcohol abuse Mother    Epilepsy Sister    Depression Sister        possible suicide attempt   Epilepsy Brother    Depression Brother        with suicide attempts   Alcohol abuse Maternal Grandfather    Colon polyps Neg Hx    Colon cancer Neg Hx    Esophageal cancer Neg Hx    Rectal cancer Neg Hx    Stomach cancer Neg Hx     Social History Social History   Tobacco Use   Smoking status: Never  Smokeless tobacco: Never  Vaping Use   Vaping Use: Never used  Substance Use Topics   Alcohol use: Yes    Alcohol/week: 1.0 standard drink    Types: 1 Standard drinks or equivalent per week   Drug use: No     Allergies   Patient has no known allergies.   Review of Systems Review of Systems  Respiratory:  Negative for shortness of breath.   Cardiovascular:  Negative for chest pain.  Neurological:  Negative for dizziness, light-headedness and headaches.    Physical Exam Triage Vital Signs ED Triage Vitals  Enc Vitals Group     BP 03/27/21 1517 (!) 165/89     Pulse Rate 03/27/21 1517 72     Resp 03/27/21 1517 18     Temp 03/27/21 1517 98.7 F (37.1 C)     Temp Source 03/27/21 1517 Oral     SpO2 03/27/21 1517 98 %     Weight --      Height --      Head Circumference --      Peak Flow --      Pain Score 03/27/21 1516 0     Pain Loc --      Pain Edu? --      Excl. in [place]? --    No data found.  Updated Vital Signs BP (!) 165/89 (BP Location: Left Arm)   Pulse 72   Temp 98.7 F (37.1 C) (Oral)    Resp 18   LMP 10/27/2016   SpO2 98%   Visual Acuity Right Eye Distance:   Left Eye Distance:   Bilateral Distance:    Right Eye Near:   Left Eye Near:    Bilateral Near:     Physical Exam Constitutional:      Appearance: Normal appearance.  Cardiovascular:     Rate and Rhythm: Normal rate and regular rhythm.     Heart sounds: Normal heart sounds.     Comments: BLE edema in feet. No carotid bruits.  Pulmonary:     Effort: Pulmonary effort is normal.     Breath sounds: Normal breath sounds.  Neurological:     Mental Status: She is alert.     UC Treatments / Results  Labs (all labs ordered are listed, but only abnormal results are displayed) Labs Reviewed - No data to display  EKG   Radiology No results found.  Procedures Procedures (including critical care time)  Medications Ordered in UC Medications - No data to display  Initial Impression / Assessment and Plan / UC Course  I have reviewed the triage vital signs and the nursing notes.  Pertinent labs & imaging results that were available during my care of the patient were reviewed by me and considered in my medical decision making (see chart for details).   D/c amlodipine, start amlodipine/benzepril. Pt doesn't have PCP, has new pt appt with new PCP on 07/02/21, so provided refills on new med.    Final Clinical Impressions(s) / UC Diagnoses   Final diagnoses:  Essential hypertension     Discharge Instructions      Follow-up with your new PCP as scheduled for the appointment in December.  Stop taking the amlodipine.  You can finish out the prescription through Sunday or you can stop taking it now, and start taking the new medicine which is a combination of amlodipine plus another medication called benazepril.  Adding benazepril should help take care of the swelling and help bring your blood  pressure down just a slight bit more.   ED Prescriptions     Medication Sig Dispense Auth. Provider    amLODipine-benazepril (LOTREL) 5-10 MG capsule Take 1 capsule by mouth daily. 30 capsule Carvel Getting, NP      PDMP not reviewed this encounter.   Carvel Getting, NP 03/27/21 1546

## 2021-03-27 NOTE — Discharge Instructions (Signed)
Follow-up with your new PCP as scheduled for the appointment in December.  Stop taking the amlodipine.  You can finish out the prescription through Sunday or you can stop taking it now, and start taking the new medicine which is a combination of amlodipine plus another medication called benazepril.  Adding benazepril should help take care of the swelling and help bring your blood pressure down just a slight bit more.

## 2021-03-27 NOTE — ED Triage Notes (Signed)
Pt states she was told to return for follow up to check her blood pressure. Reports blood pressure was 140/83 on 03/26/2021.

## 2021-07-02 ENCOUNTER — Encounter: Payer: Self-pay | Admitting: Emergency Medicine

## 2021-07-02 ENCOUNTER — Other Ambulatory Visit: Payer: Self-pay

## 2021-07-02 ENCOUNTER — Ambulatory Visit (INDEPENDENT_AMBULATORY_CARE_PROVIDER_SITE_OTHER): Payer: 59 | Admitting: Emergency Medicine

## 2021-07-02 VITALS — BP 127/74 | HR 84 | Temp 98.1°F | Ht 65.0 in | Wt 123.0 lb

## 2021-07-02 DIAGNOSIS — Z23 Encounter for immunization: Secondary | ICD-10-CM | POA: Diagnosis not present

## 2021-07-02 DIAGNOSIS — Z7689 Persons encountering health services in other specified circumstances: Secondary | ICD-10-CM | POA: Diagnosis not present

## 2021-07-02 DIAGNOSIS — Z87898 Personal history of other specified conditions: Secondary | ICD-10-CM

## 2021-07-02 DIAGNOSIS — I10 Essential (primary) hypertension: Secondary | ICD-10-CM | POA: Insufficient documentation

## 2021-07-02 LAB — COMPREHENSIVE METABOLIC PANEL
ALT: 11 U/L (ref 0–35)
AST: 25 U/L (ref 0–37)
Albumin: 4.4 g/dL (ref 3.5–5.2)
Alkaline Phosphatase: 78 U/L (ref 39–117)
BUN: 24 mg/dL — ABNORMAL HIGH (ref 6–23)
CO2: 27 mEq/L (ref 19–32)
Calcium: 10.1 mg/dL (ref 8.4–10.5)
Chloride: 95 mEq/L — ABNORMAL LOW (ref 96–112)
Creatinine, Ser: 1.77 mg/dL — ABNORMAL HIGH (ref 0.40–1.20)
GFR: 32.41 mL/min — ABNORMAL LOW (ref >60.00)
Glucose, Bld: 97 mg/dL (ref 70–99)
Potassium: 4.3 mEq/L (ref 3.5–5.1)
Sodium: 132 mEq/L — ABNORMAL LOW (ref 135–145)
Total Bilirubin: 1.4 mg/dL — ABNORMAL HIGH (ref 0.2–1.2)
Total Protein: 7.7 g/dL (ref 6.0–8.3)

## 2021-07-02 LAB — LIPID PANEL
Cholesterol: 295 mg/dL — ABNORMAL HIGH (ref 0–200)
HDL: 98.5 mg/dL (ref >39.00)
NonHDL: 196.91
Total CHOL/HDL Ratio: 3
Triglycerides: 207 mg/dL — ABNORMAL HIGH (ref 0.0–149.0)
VLDL: 41.4 mg/dL — ABNORMAL HIGH (ref 0.0–40.0)

## 2021-07-02 LAB — LDL CHOLESTEROL, DIRECT: Direct LDL: 153 mg/dL

## 2021-07-02 LAB — HEMOGLOBIN A1C: Hgb A1c MFr Bld: 5.4 % (ref 4.6–6.5)

## 2021-07-02 MED ORDER — LOSARTAN POTASSIUM-HCTZ 100-12.5 MG PO TABS: 1.0000 | ORAL_TABLET | Freq: Every day | ORAL | 3 refills | Status: DC

## 2021-07-02 NOTE — Assessment & Plan Note (Signed)
Stable.  On no medications.  Last seizure 2014.

## 2021-07-02 NOTE — Progress Notes (Signed)
Robin Henderson 55 y.o.   Chief Complaint  Patient presents with   New Patient (Initial Visit)    Manage BP    HISTORY OF PRESENT ILLNESS: This is a 55 y.o. female first visit to this office here to establish care with me. Recently diagnosed with hypertension and started on amlodipine-benazepril 5-10 mg daily. Complaining of ankle swelling and dry cough since starting medication. Past medical history significant for history of seizures.  Last seizure in 2014.  Not presently on medication. Non-smoker.  Increased levels of stress due to the significant other's illness i.e. prostate cancer. Blood pressure this morning 127/74. No other complaint or medical concerns today. Sees gynecologist on a regular basis.  Up-to-date with mammogram and colonoscopy. Colonoscopy done on 02/21/2021 showed 1 small sessile polyp and small mouth diverticulosis. Mammogram done on 03/07/2021 as follows: IMPRESSION: 1. Stable, probably benign right breast mass. Recommend a final ultrasound follow-up in 1 year. 2. No mammographic evidence of malignancy on the left.   RECOMMENDATION: Bilateral diagnostic mammogram and right breast ultrasound in 1 year.   HPI   Prior to Admission medications   Medication Sig Start Date End Date Taking? Authorizing Provider  amLODipine-benazepril (LOTREL) 5-10 MG capsule Take 1 capsule by mouth daily. 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  Almost slit wrists and almost jumped off a parking deck    Past Surgical History:  Procedure Laterality Date   COLONOSCOPY  02/2021   EXTERNAL EAR SURGERY  1995   correct earring hole   HEAD & NECK SKIN LESION EXCISIONAL BIOPSY  1995   lip lesion   WISDOM TOOTH EXTRACTION      Social History   Socioeconomic History   Marital status: Single    Spouse name: Not on file   Number of children: Not on file   Years of education: Not on file   Highest education level: Not on file  Occupational History   Not on file  Tobacco Use   Smoking status: Never   Smokeless tobacco: Never  Vaping Use   Vaping Use: Never used  Substance and Sexual Activity   Alcohol use: Yes    Alcohol/week: 1.0 standard drink    Types: 1 Standard drinks or equivalent per week   Drug use: No   Sexual activity: Yes    Comment: N/A insurance questions  Other Topics Concern   Not on file  Social History Narrative   Works.  Moving to new apartment to be nearer her boyfriend.     Social Determinants of Health   Financial Resource Strain: Not on file  Food Insecurity: Not on file  Transportation  Needs: Not on file  Physical Activity: Not on file  Stress: Not on file  Social Connections: Not on file  Intimate Partner Violence: Not on file    Family History  Problem Relation Age of Onset   Heart disease Mother    Alcohol abuse Mother    Epilepsy Sister    Depression Sister        possible suicide attempt   Epilepsy Brother    Depression Brother        with suicide attempts   Alcohol abuse Maternal Grandfather    Colon polyps Neg Hx    Colon cancer Neg Hx    Esophageal cancer Neg Hx    Rectal cancer Neg Hx    Stomach cancer Neg Hx       Review of Systems  Constitutional: Negative.  Negative for chills and fever.  HENT: Negative.  Negative for congestion and sore throat.   Respiratory: Negative.  Negative for cough and shortness of breath.   Cardiovascular: Negative.  Negative for chest pain and palpitations.  Gastrointestinal: Negative.  Negative for abdominal pain, diarrhea, nausea and vomiting.  Genitourinary: Negative.  Negative for dysuria and hematuria.  Musculoskeletal: Negative.  Negative for back pain and myalgias.  Skin: Negative.  Negative for rash.  Neurological: Negative.  Negative for dizziness and headaches.  All other systems reviewed and are negative.  Today's Vitals   07/02/21 1102  BP: 140/82  Pulse: 84  Temp: 98.1 F (36.7 C)  TempSrc: Oral  SpO2: 99%  Weight: 123 lb (55.8 kg)  Height: 5\' 5"  (1.651 m)   Body mass index is 20.47 kg/m.  Physical Exam Vitals reviewed.  Constitutional:      Appearance: Normal appearance.  HENT:     Head: Normocephalic.     Right Ear: Tympanic membrane, ear canal and external ear normal.     Left Ear: Tympanic membrane, ear canal and external ear normal.     Mouth/Throat:     Mouth: Mucous membranes are moist.     Pharynx: Oropharynx is clear.  Eyes:     Extraocular Movements: Extraocular movements intact.     Conjunctiva/sclera: Conjunctivae normal.     Pupils: Pupils are equal, round, and reactive to light.  Cardiovascular:     Rate and Rhythm: Normal rate and regular rhythm.     Pulses: Normal pulses.     Heart sounds: Normal heart sounds.  Pulmonary:     Effort: Pulmonary effort is normal.     Breath sounds: Normal breath sounds.  Abdominal:     Palpations: Abdomen is soft.     Tenderness: There is no abdominal tenderness.  Musculoskeletal:        General: Normal range of motion.     Cervical back: Normal range of motion and neck supple. No tenderness.     Right lower leg: No edema.     Left lower leg: No edema.  Lymphadenopathy:      Cervical: No cervical adenopathy.  Skin:    General: Skin is warm and dry.     Capillary Refill: Capillary refill takes less than 2 seconds.  Neurological:     General: No focal deficit present.     Mental Status: She is alert and oriented to person, place, and time.  Psychiatric:        Mood and Affect: Mood normal.        Behavior: Behavior normal.     ASSESSMENT & PLAN: A total of 47 minutes  was spent with the patient and counseling/coordination of care regarding preparing for this visit, establishing care with me, review of past medical history, comprehensive history and physical examination, hypertension and cardiovascular risks associated with this condition, hypertension management and need to monitor blood pressure readings at home daily for the next several weeks and keep a log, ED precautions, review of medication and changes made, education on nutrition, health maintenance items, need for blood work today, prognosis, documentation and need for follow-up in 3 months.  Problem List Items Addressed This Visit       Cardiovascular and Mediastinum   Essential hypertension - Primary    Recent history of hypertension.  Present medication of amlodipine-benazepril causing undesirable side effects and affecting quality of life. Will change to losartan-HCTZ 100-12.5 mg daily. Advised to monitor blood pressure readings at home daily for the next couple weeks and keep a log. Diet and nutrition discussed.  Blood work done today. Follow-up in 3 months.      Relevant Medications   losartan-hydrochlorothiazide (HYZAAR) 100-12.5 MG tablet   Other Relevant Orders   Comprehensive metabolic panel   Hemoglobin A1c   Lipid panel   Other Visit Diagnoses     Need for influenza vaccination       Relevant Orders   Flu Vaccine QUAD 37mo+IM (Fluarix, Fluzone & Alfiuria Quad PF) (Completed)   History of seizures       Encounter to establish care          Patient Instructions  Hypertension,  Adult High blood pressure (hypertension) is when the force of blood pumping through the arteries is too strong. The arteries are the blood vessels that carry blood from the heart throughout the body. Hypertension forces the heart to work harder to pump blood and may cause arteries to become narrow or stiff. Untreated or uncontrolled hypertension can cause a heart attack, heart failure, a stroke, kidney disease, and other problems. A blood pressure reading consists of a higher number over a lower number. Ideally, your blood pressure should be below 120/80. The first ("top") number is called the systolic pressure. It is a measure of the pressure in your arteries as your heart beats. The second ("bottom") number is called the diastolic pressure. It is a measure of the pressure in your arteries as the heart relaxes. What are the causes? The exact cause of this condition is not known. There are some conditions that result in or are related to high blood pressure. What increases the risk? Some risk factors for high blood pressure are under your control. The following factors may make you more likely to develop this condition: Smoking. Having type 2 diabetes mellitus, high cholesterol, or both. Not getting enough exercise or physical activity. Being overweight. Having too much fat, sugar, calories, or salt (sodium) in your diet. Drinking too much alcohol. Some risk factors for high blood pressure may be difficult or impossible to change. Some of these factors include: Having chronic kidney disease. Having a family history of high blood pressure. Age. Risk increases with age. Race. You may be at higher risk if you are African American. Gender. Men are at higher risk than women before age 30. After age 36, women are at higher risk than men. Having obstructive sleep apnea. Stress. What are the signs or symptoms? High blood pressure may not cause symptoms. Very high blood pressure (hypertensive crisis)  may cause: Headache. Anxiety. Shortness of breath. Nosebleed. Nausea and vomiting. Vision changes. Severe chest pain. Seizures. How is  this diagnosed? This condition is diagnosed by measuring your blood pressure while you are seated, with your arm resting on a flat surface, your legs uncrossed, and your feet flat on the floor. The cuff of the blood pressure monitor will be placed directly against the skin of your upper arm at the level of your heart. It should be measured at least twice using the same arm. Certain conditions can cause a difference in blood pressure between your right and left arms. Certain factors can cause blood pressure readings to be lower or higher than normal for a short period of time: When your blood pressure is higher when you are in a health care provider's office than when you are at home, this is called white coat hypertension. Most people with this condition do not need medicines. When your blood pressure is higher at home than when you are in a health care provider's office, this is called masked hypertension. Most people with this condition may need medicines to control blood pressure. If you have a high blood pressure reading during one visit or you have normal blood pressure with other risk factors, you may be asked to: Return on a different day to have your blood pressure checked again. Monitor your blood pressure at home for 1 week or longer. If you are diagnosed with hypertension, you may have other blood or imaging tests to help your health care provider understand your overall risk for other conditions. How is this treated? This condition is treated by making healthy lifestyle changes, such as eating healthy foods, exercising more, and reducing your alcohol intake. Your health care provider may prescribe medicine if lifestyle changes are not enough to get your blood pressure under control, and if: Your systolic blood pressure is above 130. Your diastolic  blood pressure is above 80. Your personal target blood pressure may vary depending on your medical conditions, your age, and other factors. Follow these instructions at home: Eating and drinking  Eat a diet that is high in fiber and potassium, and low in sodium, added sugar, and fat. An example eating plan is called the DASH (Dietary Approaches to Stop Hypertension) diet. To eat this way: Eat plenty of fresh fruits and vegetables. Try to fill one half of your plate at each meal with fruits and vegetables. Eat whole grains, such as whole-wheat pasta, brown rice, or whole-grain bread. Fill about one fourth of your plate with whole grains. Eat or drink low-fat dairy products, such as skim milk or low-fat yogurt. Avoid fatty cuts of meat, processed or cured meats, and poultry with skin. Fill about one fourth of your plate with lean proteins, such as fish, chicken without skin, beans, eggs, or tofu. Avoid pre-made and processed foods. These tend to be higher in sodium, added sugar, and fat. Reduce your daily sodium intake. Most people with hypertension should eat less than 1,500 mg of sodium a day. Do not drink alcohol if: Your health care provider tells you not to drink. You are pregnant, may be pregnant, or are planning to become pregnant. If you drink alcohol: Limit how much you use to: 0-1 drink a day for women. 0-2 drinks a day for men. Be aware of how much alcohol is in your drink. In the [place] one drink equals one 12 oz bottle of beer (355 mL), one 5 oz glass of wine (148 mL), or one 1 oz glass of hard liquor (44 mL). Lifestyle  Work with your health care provider to maintain a  healthy body weight or to lose weight. Ask what an ideal weight is for you. Get at least 30 minutes of exercise most days of the week. Activities may include walking, swimming, or biking. Include exercise to strengthen your muscles (resistance exercise), such as Pilates or lifting weights, as part of your weekly  exercise routine. Try to do these types of exercises for 30 minutes at least 3 days a week. Do not use any products that contain nicotine or tobacco, such as cigarettes, e-cigarettes, and chewing tobacco. If you need help quitting, ask your health care provider. Monitor your blood pressure at home as told by your health care provider. Keep all follow-up visits as told by your health care provider. This is important. Medicines Take over-the-counter and prescription medicines only as told by your health care provider. Follow directions carefully. Blood pressure medicines must be taken as prescribed. Do not skip doses of blood pressure medicine. Doing this puts you at risk for problems and can make the medicine less effective. Ask your health care provider about side effects or reactions to medicines that you should watch for. Contact a health care provider if you: Think you are having a reaction to a medicine you are taking. Have headaches that keep coming back (recurring). Feel dizzy. Have swelling in your ankles. Have trouble with your vision. Get help right away if you: Develop a severe headache or confusion. Have unusual weakness or numbness. Feel faint. Have severe pain in your chest or abdomen. Vomit repeatedly. Have trouble breathing. Summary Hypertension is when the force of blood pumping through your arteries is too strong. If this condition is not controlled, it may put you at risk for serious complications. Your personal target blood pressure may vary depending on your medical conditions, your age, and other factors. For most people, a normal blood pressure is less than 120/80. Hypertension is treated with lifestyle changes, medicines, or a combination of both. Lifestyle changes include losing weight, eating a healthy, low-sodium diet, exercising more, and limiting alcohol. This information is not intended to replace advice given to you by your health care provider. Make sure you  discuss any questions you have with your health care provider. Document Revised: 03/02/2018 Document Reviewed: 03/02/2018 Elsevier Patient Education  2022 [place], [place] [facility]

## 2021-07-02 NOTE — Patient Instructions (Signed)

## 2021-07-02 NOTE — Assessment & Plan Note (Signed)
Recent history of hypertension.  Present medication of amlodipine-benazepril causing undesirable side effects and affecting quality of life. Will change to losartan-HCTZ 100-12.5 mg daily. Advised to monitor blood pressure readings at home daily for the next couple weeks and keep a log. Diet and nutrition discussed.  Blood work done today. Follow-up in 3 months.

## 2021-07-03 ENCOUNTER — Encounter: Payer: Self-pay | Admitting: Emergency Medicine

## 2021-07-03 ENCOUNTER — Other Ambulatory Visit: Payer: Self-pay | Admitting: Emergency Medicine

## 2021-07-03 DIAGNOSIS — E785 Hyperlipidemia, unspecified: Secondary | ICD-10-CM

## 2021-07-03 DIAGNOSIS — N1832 Chronic kidney disease, stage 3b: Secondary | ICD-10-CM

## 2021-07-03 DIAGNOSIS — I10 Essential (primary) hypertension: Secondary | ICD-10-CM

## 2021-07-03 MED ORDER — ROSUVASTATIN CALCIUM 10 MG PO TABS
10.0000 mg | ORAL_TABLET | Freq: Every day | ORAL | 1 refills | Status: DC
  Filled 2021-12-30: qty 90, 90d supply, fill #0
  Filled 2022-03-25: qty 30, 30d supply, fill #1
  Filled 2022-04-30: qty 30, 30d supply, fill #2
  Filled 2022-05-29: qty 30, 30d supply, fill #3

## 2021-07-03 NOTE — Progress Notes (Signed)
Blood work shows CKD stage IIIb and dyslipidemia. Referral to nephrologist placed today. Started on rosuvastatin 10 mg daily.

## 2021-09-15 ENCOUNTER — Encounter: Payer: Self-pay | Admitting: Emergency Medicine

## 2021-09-17 NOTE — Telephone Encounter (Signed)
Sent pt my chart message on how to get records from another provider's offices. ?

## 2021-09-24 ENCOUNTER — Encounter: Payer: Self-pay | Admitting: Emergency Medicine

## 2021-09-29 ENCOUNTER — Ambulatory Visit: Payer: 59 | Admitting: Family Medicine

## 2021-09-29 ENCOUNTER — Other Ambulatory Visit (HOSPITAL_COMMUNITY)
Admission: RE | Admit: 2021-09-29 | Discharge: 2021-09-29 | Disposition: A | Discharge status: Home / Self Care | Payer: 59 | Source: Ambulatory Visit | Attending: Family Medicine | Admitting: Family Medicine

## 2021-09-29 ENCOUNTER — Encounter: Payer: Self-pay | Admitting: Family Medicine

## 2021-09-29 ENCOUNTER — Other Ambulatory Visit: Payer: Self-pay

## 2021-09-29 VITALS — BP 179/99 | HR 70 | Ht 64.0 in | Wt 119.3 lb

## 2021-09-29 DIAGNOSIS — Z124 Encounter for screening for malignant neoplasm of cervix: Secondary | ICD-10-CM | POA: Diagnosis present

## 2021-09-29 DIAGNOSIS — Z01419 Encounter for gynecological examination (general) (routine) without abnormal findings: Secondary | ICD-10-CM

## 2021-09-29 NOTE — Progress Notes (Signed)
Subjective:  ?  ? Robin Henderson is a 56 y.o. female and is here for a comprehensive physical exam. The patient reports no problems. ? ? ?The following portions of the patient's history were reviewed and updated as appropriate: allergies, current medications, past family history, past medical history, past social history, past surgical history, and problem list. ? ?Review of Systems ?Pertinent items noted in HPI and remainder of comprehensive ROS otherwise negative.  ? ?Objective:  ? ? BP (!) 179/99   Pulse 70   Ht '5\' 4"'$  (1.626 m)   Wt 119 lb 4.8 oz (54.1 kg)   LMP 10/27/2016   BMI 20.48 kg/m?  ?General appearance: alert, cooperative, and appears stated age ?Head: Normocephalic, without obvious abnormality, atraumatic ?Neck: no adenopathy, supple, symmetrical, trachea midline, and thyroid not enlarged, symmetric, no tenderness/mass/nodules ?Lungs: clear to auscultation bilaterally ?Breasts: normal appearance, no masses or tenderness ?Heart: regular rate and rhythm, S1, S2 normal, no murmur, click, rub or gallop ?Abdomen: soft, non-tender; bowel sounds normal; no masses,  no organomegaly ?Pelvic: cervix normal in appearance, external genitalia normal, no adnexal masses or tenderness, no cervical motion tenderness, uterus normal size, shape, and consistency, and vagina normal without discharge ?Extremities: extremities normal, atraumatic, no cyanosis or edema ?Pulses: 2+ and symmetric ?Skin: Skin color, texture, turgor normal. No rashes or lesions ?Lymph nodes: Cervical, supraclavicular, and axillary nodes normal. ?Neurologic: Grossly normal  ?  ?Assessment:  ? ? Healthy female exam.    ?  ?Plan:  ?Screening for malignant neoplasm of cervix - Plan: Cytology - PAP( [place]), CANCELED: Cytology - PAP( [place]) ? ?Encounter for gynecological examination without abnormal finding ? ?Return in 1 year (on 09/30/2022). ? ? ?  ?See After Visit Summary for Counseling Recommendations  ? ?

## 2021-10-01 ENCOUNTER — Ambulatory Visit (INDEPENDENT_AMBULATORY_CARE_PROVIDER_SITE_OTHER): Payer: 59 | Admitting: Emergency Medicine

## 2021-10-01 ENCOUNTER — Encounter: Payer: Self-pay | Admitting: Emergency Medicine

## 2021-10-01 VITALS — BP 220/108 | HR 84 | Temp 98.0°F | Ht 64.0 in | Wt 120.1 lb

## 2021-10-01 DIAGNOSIS — Z23 Encounter for immunization: Secondary | ICD-10-CM

## 2021-10-01 DIAGNOSIS — I10 Essential (primary) hypertension: Secondary | ICD-10-CM | POA: Diagnosis not present

## 2021-10-01 DIAGNOSIS — N1832 Chronic kidney disease, stage 3b: Secondary | ICD-10-CM

## 2021-10-01 LAB — CYTOLOGY - PAP
Adequacy: ABSENT
Comment: NEGATIVE
Diagnosis: NEGATIVE
High risk HPV: NEGATIVE

## 2021-10-01 MED ORDER — VALSARTAN 160 MG PO TABS
160.0000 mg | ORAL_TABLET | Freq: Every day | ORAL | 2 refills | Status: DC
Start: 2021-10-01 — End: 2021-11-18
  Filled 2021-11-17: qty 30, 30d supply, fill #0

## 2021-10-01 NOTE — Assessment & Plan Note (Signed)
Uncontrolled hypertension.  Stop losartan and start valsartan 160 mg daily. ?Continue labetalol 200 mg twice a day.  Continue monitoring daily blood pressure readings at home for the next couple weeks.  Increase valsartan to 320 mg daily if readings persistently high. ?We will follow-up with vascular surgeon next month. ?Follow-up in 3 months.  Earlier as needed ?

## 2021-10-01 NOTE — Assessment & Plan Note (Signed)
Secondary to longstanding hypertension. ?

## 2021-10-01 NOTE — Patient Instructions (Signed)
Stop losartan.  Continue labetalol 200 mg twice a day. ?Start valsartan 160 mg daily.  Increase to 320 mg daily if blood pressure readings persistently high. ?Follow-up with vascular surgeon next month. ?Follow-up with kidney doctor as scheduled. ?Follow-up with me in 3 months, earlier as needed. ? ?Hypertension, Adult ?High blood pressure (hypertension) is when the force of blood pumping through the arteries is too strong. The arteries are the blood vessels that carry blood from the heart throughout the body. Hypertension forces the heart to work harder to pump blood and may cause arteries to become narrow or stiff. Untreated or uncontrolled hypertension can cause a heart attack, heart failure, a stroke, kidney disease, and other problems. ?A blood pressure reading consists of a higher number over a lower number. Ideally, your blood pressure should be below 120/80. The first ("top") number is called the systolic pressure. It is a measure of the pressure in your arteries as your heart beats. The second ("bottom") number is called the diastolic pressure. It is a measure of the pressure in your arteries as the heart relaxes. ?What are the causes? ?The exact cause of this condition is not known. There are some conditions that result in or are related to high blood pressure. ?What increases the risk? ?Some risk factors for high blood pressure are under your control. The following factors may make you more likely to develop this condition: ?Smoking. ?Having type 2 diabetes mellitus, high cholesterol, or both. ?Not getting enough exercise or physical activity. ?Being overweight. ?Having too much fat, sugar, calories, or salt (sodium) in your diet. ?Drinking too much alcohol. ?Some risk factors for high blood pressure may be difficult or impossible to change. Some of these factors include: ?Having chronic kidney disease. ?Having a family history of high blood pressure. ?Age. Risk increases with age. ?Race. You may be at  higher risk if you are African American. ?Gender. Men are at higher risk than women before age 51. After age 23, women are at higher risk than men. ?Having obstructive sleep apnea. ?Stress. ?What are the signs or symptoms? ?High blood pressure may not cause symptoms. Very high blood pressure (hypertensive crisis) may cause: ?Headache. ?Anxiety. ?Shortness of breath. ?Nosebleed. ?Nausea and vomiting. ?Vision changes. ?Severe chest pain. ?Seizures. ?How is this diagnosed? ?This condition is diagnosed by measuring your blood pressure while you are seated, with your arm resting on a flat surface, your legs uncrossed, and your feet flat on the floor. The cuff of the blood pressure monitor will be placed directly against the skin of your upper arm at the level of your heart. It should be measured at least twice using the same arm. Certain conditions can cause a difference in blood pressure between your right and left arms. ?Certain factors can cause blood pressure readings to be lower or higher than normal for a short period of time: ?When your blood pressure is higher when you are in a health care provider's office than when you are at home, this is called white coat hypertension. Most people with this condition do not need medicines. ?When your blood pressure is higher at home than when you are in a health care provider's office, this is called masked hypertension. Most people with this condition may need medicines to control blood pressure. ?If you have a high blood pressure reading during one visit or you have normal blood pressure with other risk factors, you may be asked to: ?Return on a different day to have your blood pressure checked  again. ?Monitor your blood pressure at home for 1 week or longer. ?If you are diagnosed with hypertension, you may have other blood or imaging tests to help your health care provider understand your overall risk for other conditions. ?How is this treated? ?This condition is treated  by making healthy lifestyle changes, such as eating healthy foods, exercising more, and reducing your alcohol intake. Your health care provider may prescribe medicine if lifestyle changes are not enough to get your blood pressure under control, and if: ?Your systolic blood pressure is above 130. ?Your diastolic blood pressure is above 80. ?Your personal target blood pressure may vary depending on your medical conditions, your age, and other factors. ?Follow these instructions at home: ?Eating and drinking ? ?Eat a diet that is high in fiber and potassium, and low in sodium, added sugar, and fat. An example eating plan is called the DASH (Dietary Approaches to Stop Hypertension) diet. To eat this way: ?Eat plenty of fresh fruits and vegetables. Try to fill one half of your plate at each meal with fruits and vegetables. ?Eat whole grains, such as whole-wheat pasta, brown rice, or whole-grain bread. Fill about one fourth of your plate with whole grains. ?Eat or drink low-fat dairy products, such as skim milk or low-fat yogurt. ?Avoid fatty cuts of meat, processed or cured meats, and poultry with skin. Fill about one fourth of your plate with lean proteins, such as fish, chicken without skin, beans, eggs, or tofu. ?Avoid pre-made and processed foods. These tend to be higher in sodium, added sugar, and fat. ?Reduce your daily sodium intake. Most people with hypertension should eat less than 1,500 mg of sodium a day. ?Do not drink alcohol if: ?Your health care provider tells you not to drink. ?You are pregnant, may be pregnant, or are planning to become pregnant. ?If you drink alcohol: ?Limit how much you use to: ?0-1 drink a day for women. ?0-2 drinks a day for men. ?Be aware of how much alcohol is in your drink. In the [place] one drink equals one 12 oz bottle of beer (355 mL), one 5 oz glass of wine (148 mL), or one 1? oz glass of hard liquor (44 mL). ?Lifestyle ? ?Work with your health care provider to maintain a  healthy body weight or to lose weight. Ask what an ideal weight is for you. ?Get at least 30 minutes of exercise most days of the week. Activities may include walking, swimming, or biking. ?Include exercise to strengthen your muscles (resistance exercise), such as Pilates or lifting weights, as part of your weekly exercise routine. Try to do these types of exercises for 30 minutes at least 3 days a week. ?Do not use any products that contain nicotine or tobacco, such as cigarettes, e-cigarettes, and chewing tobacco. If you need help quitting, ask your health care provider. ?Monitor your blood pressure at home as told by your health care provider. ?Keep all follow-up visits as told by your health care provider. This is important. ?Medicines ?Take over-the-counter and prescription medicines only as told by your health care provider. Follow directions carefully. Blood pressure medicines must be taken as prescribed. ?Do not skip doses of blood pressure medicine. Doing this puts you at risk for problems and can make the medicine less effective. ?Ask your health care provider about side effects or reactions to medicines that you should watch for. ?Contact a health care provider if you: ?Think you are having a reaction to a medicine you are taking. ?Have  headaches that keep coming back (recurring). ?Feel dizzy. ?Have swelling in your ankles. ?Have trouble with your vision. ?Get help right away if you: ?Develop a severe headache or confusion. ?Have unusual weakness or numbness. ?Feel faint. ?Have severe pain in your chest or abdomen. ?Vomit repeatedly. ?Have trouble breathing. ?Summary ?Hypertension is when the force of blood pumping through your arteries is too strong. If this condition is not controlled, it may put you at risk for serious complications. ?Your personal target blood pressure may vary depending on your medical conditions, your age, and other factors. For most people, a normal blood pressure is less than  120/80. ?Hypertension is treated with lifestyle changes, medicines, or a combination of both. Lifestyle changes include losing weight, eating a healthy, low-sodium diet, exercising more, and limiting alcohol. ?Thi

## 2021-10-01 NOTE — Progress Notes (Signed)
Robin Henderson ?56 y.o. ? ? ?Chief Complaint  ?Patient presents with  ? Follow-up  ? blood pressure concern  ? ? ?HISTORY OF PRESENT ILLNESS: ?This is a 56 y.o. female here for hypertension follow-up. ?First seen by me on 07/02/2021 with hypertension. ?Blood work showed chronic kidney disease. ?Evaluated by nephrologist on 08/28/2021.  Office visit notes reviewed. ?Found to have renal artery stenosis and referred to vascular surgeon. ?Hydrochlorothiazide was discontinued due to hyponatremia ?Presently on losartan 100 mg and labetalol 200 mg twice a day ?Blood pressure readings still elevated at home. ?Asymptomatic. ? ?HPI ? ? ?Prior to Admission medications   ?Medication Sig Start Date End Date Taking? Authorizing Provider  ?labetalol (NORMODYNE) 200 MG tablet Take 200 mg by mouth 2 (two) times daily.   Yes [provider]  ?rosuvastatin (CRESTOR) 10 MG tablet Take 1 tablet (10 mg total) by mouth daily. 07/03/21  Yes Horald Pollen, MD  ? ? ?No Known Allergies ? ?Patient Active Problem List  ? Diagnosis Date Noted  ? Stage 3b chronic kidney disease ([place]) 07/03/2021  ? Essential hypertension 07/02/2021  ? 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 Macrocytosis without anemia 04/27/2013  ? Epilepsy ([place])   ? Anxiety and depression   ? Suicide attempt [facility])   ? 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 Depression 10/06/2012  ? Restless legs syndrome (RLS) 05/06/2012  ? Other sleep disturbances 05/06/2012  ? Generalized convulsive epilepsy without mention of intractable epilepsy 05/06/2012  ? ? ?Past Medical History:  ?Diagnosis Date  ? Alcohol abuse   ? Anxiety and depression   ? Epilepsy ([place])   ? grand mal seizures < 1 minute-LAST SEIZURE (12/14/20214)  ? Hemorrhoids   ? 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 removed at colonoscopy  ? Suicide attempt ([place])   ? x 2.  Almost slit wrists and almost jumped off a parking deck  ? ? ?Past Surgical History:   ?Procedure Laterality Date  ? COLONOSCOPY  02/2021  ? EXTERNAL EAR SURGERY  1995  ? correct earring hole  ? HEAD & NECK SKIN LESION EXCISIONAL BIOPSY  1995  ? lip lesion  ? WISDOM TOOTH EXTRACTION    ? ? ?Social History  ? ?Socioeconomic History  ? Marital status: Single  ?  Spouse name: Not on file  ? Number of children: Not on file  ? Years of education: Not on file  ? Highest education level: Not on file  ?Occupational History  ? Not on file  ?Tobacco Use  ? Smoking status: Never  ? Smokeless tobacco: Never  ?Vaping Use  ? Vaping Use: Never used  ?Substance and Sexual Activity  ? Alcohol use: Yes  ?  Alcohol/week: 1.0 standard drink  ?  Types: 1 Standard drinks or equivalent per week  ? Drug use: No  ? Sexual activity: Yes  ?  Comment: N/A insurance questions  ?Other Topics Concern  ? Not on file  ?Social History Narrative  ? Works.  Moving to new apartment to be nearer her boyfriend.    ? ?Social Determinants of Health  ? ?Financial Resource Strain: Not on file  ?Food Insecurity: Not on file  ?Transportation Needs: Not on file  ?Physical Activity: Not on file  ?Stress: Not on file  ?Social Connections: Not on file  ?Intimate Partner Violence: Not on file  ? ? ?Family History  ?Problem Relation Age of Onset  ? Heart disease Mother   ?  Alcohol abuse Mother   ? Epilepsy Sister   ? Depression Sister   ?     possible suicide attempt  ? Epilepsy Brother   ? Depression Brother   ?     with suicide attempts  ? Alcohol abuse Maternal Grandfather   ? Colon polyps Neg Hx   ? Colon cancer Neg Hx   ? Esophageal cancer Neg Hx   ? Rectal cancer Neg Hx   ? Stomach cancer Neg Hx   ? ? ? ?Review of Systems  ?Constitutional: Negative.  Negative for chills and fever.  ?HENT: Negative.  Negative for congestion and sore throat.   ?Respiratory: Negative.  Negative for cough and shortness of breath.   ?Cardiovascular: Negative.  Negative for chest pain and palpitations.  ?Gastrointestinal:  Negative for abdominal pain, diarrhea,  nausea and vomiting.  ?Genitourinary: Negative.   ?Skin: Negative.  Negative for rash.  ?Neurological: Negative.  Negative for dizziness and headaches.  ?All other systems reviewed and are negative. ? ?Today's Vitals  ? 10/01/21 0937  ?BP: (!) 220/108  ?Pulse: 84  ?Temp: 98 ?F (36.7 ?C)  ?TempSrc: Oral  ?SpO2: 94%  ?Weight: 120 lb 2 oz (54.5 kg)  ?Height: '5\' 4"'$  (1.626 m)  ? ?Body mass index is 20.62 kg/m?. ?Wt Readings from Last 3 Encounters:  ?10/01/21 120 lb 2 oz (54.5 kg)  ?09/29/21 119 lb 4.8 oz (54.1 kg)  ?07/02/21 123 lb (55.8 kg)  ? ? ?Physical Exam ?Vitals reviewed.  ?Constitutional:   ?   Appearance: Normal appearance.  ?HENT:  ?   Head: Normocephalic.  ?Eyes:  ?   Extraocular Movements: Extraocular movements intact.  ?   Conjunctiva/sclera: Conjunctivae normal.  ?   Pupils: Pupils are equal, round, and reactive to light.  ?Cardiovascular:  ?   Rate and Rhythm: Normal rate and regular rhythm.  ?   Pulses: Normal pulses.  ?   Heart sounds: Normal heart sounds.  ?Pulmonary:  ?   Effort: Pulmonary effort is normal.  ?   Breath sounds: Normal breath sounds.  ?Abdominal:  ?   Palpations: Abdomen is soft.  ?   Tenderness: There is no abdominal tenderness.  ?Musculoskeletal:  ?   Cervical back: No tenderness.  ?   Right lower leg: No edema.  ?   Left lower leg: No edema.  ?Lymphadenopathy:  ?   Cervical: No cervical adenopathy.  ?Skin: ?   General: Skin is warm and dry.  ?   Capillary Refill: Capillary refill takes less than 2 seconds.  ?Neurological:  ?   General: No focal deficit present.  ?   Mental Status: She is alert and oriented to person, place, and time.  ?Psychiatric:     ?   Mood and Affect: Mood normal.     ?   Behavior: Behavior normal.  ? ? ? ?ASSESSMENT & PLAN: ?A total of 50 minutes was spent with the patient and counseling/coordination of care regarding preparing for this visit, review of most recent office visit notes, review of most recent nephrologist's office visit notes, review of most recent  blood work results, approach to uncontrolled hypertension, review of all medications and changes made, ED precautions, cardiovascular risks associated with uncontrolled hypertension, prognosis, documentation and need for follow-up. ? ?Problem List Items Addressed This Visit   ? ?  ? Cardiovascular and Mediastinum  ? Essential hypertension  ?  Uncontrolled hypertension.  Stop losartan and start valsartan 160 mg daily. ?Continue labetalol 200 mg  twice a day.  Continue monitoring daily blood pressure readings at home for the next couple weeks.  Increase valsartan to 320 mg daily if readings persistently high. ?We will follow-up with vascular surgeon next month. ?Follow-up in 3 months.  Earlier as needed ?  ?  ? Relevant Medications  ? valsartan (DIOVAN) 160 MG tablet  ?  ? Genitourinary  ? Stage 3b chronic kidney disease ([place])  ?  Secondary to longstanding hypertension. ?  ?  ? ?Other Visit Diagnoses   ? ? Uncontrolled hypertension    -  Primary  ? Relevant Medications  ? valsartan (DIOVAN) 160 MG tablet  ? Need for vaccination      ? Relevant Orders  ? Varicella-zoster vaccine IM (Shingrix) (Completed)  ? ?  ? ?Patient Instructions  ?Stop losartan.  Continue labetalol 200 mg twice a day. ?Start valsartan 160 mg daily.  Increase to 320 mg daily if blood pressure readings persistently high. ?Follow-up with vascular surgeon next month. ?Follow-up with kidney doctor as scheduled. ?Follow-up with me in 3 months, earlier as needed. ? ?Hypertension, Adult ?High blood pressure (hypertension) is when the force of blood pumping through the arteries is too strong. The arteries are the blood vessels that carry blood from the heart throughout the body. Hypertension forces the heart to work harder to pump blood and may cause arteries to become narrow or stiff. Untreated or uncontrolled hypertension can cause a heart attack, heart failure, a stroke, kidney disease, and other problems. ?A blood pressure reading consists of a higher  number over a lower number. Ideally, your blood pressure should be below 120/80. The first ("top") number is called the systolic pressure. It is a measure of the pressure in your arteries as your heart beats.

## 2021-10-28 ENCOUNTER — Encounter: Payer: Self-pay | Admitting: Vascular Surgery

## 2021-10-28 ENCOUNTER — Ambulatory Visit: Payer: 59 | Admitting: Vascular Surgery

## 2021-10-28 DIAGNOSIS — I701 Atherosclerosis of renal artery: Secondary | ICD-10-CM | POA: Diagnosis not present

## 2021-10-28 NOTE — Progress Notes (Signed)
? ? ?Patient name: Robin Henderson MRN: [id number] DOB: Dec 20, 1965 Sex: female ? ?REASON FOR CONSULT: Evaluate for renal artery stenting ? ?HPI: ?Robin Henderson is a 56 y.o. female, with history of hypertension, stage III CKD, and epilepsy that presents for evaluation of renal artery stenting.  Patient is referred by Dr. Hollie Salk with nephrology.  She states she was in her normal state of health until about August of last year in 2022 when she noticed that she had increasing blood pressure problems.  She has now been started on labetalol and valsartan and her blood pressure runs anywhere from 782-956 systolic.  She had a renal ultrasound for evaluation of renal artery stenosis with mild stenosis on the right.  The proximal renal artery velocity on the right was 214.  The right renal artery to aortic ratio was 2.1.  She denies any previous vascular inventions. ? ?Past Medical History:  ?Diagnosis Date  ? Alcohol abuse   ? Anxiety and depression   ? Epilepsy ([place])   ? grand mal seizures < 1 minute-LAST SEIZURE (12/14/20214)  ? Hemorrhoids   ? 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 removed at colonoscopy  ? Suicide attempt ([place])   ? x 2.  Almost slit wrists and almost jumped off a parking deck  ? ? ?Past Surgical History:  ?Procedure Laterality Date  ? COLONOSCOPY  02/2021  ? EXTERNAL EAR SURGERY  1995  ? correct earring hole  ? HEAD & NECK SKIN LESION EXCISIONAL BIOPSY  1995  ? lip lesion  ? WISDOM TOOTH EXTRACTION    ? ? ?Family History  ?Problem Relation Age of Onset  ? Heart disease Mother   ? Alcohol abuse Mother   ? Epilepsy Sister   ? Depression Sister   ?     possible suicide attempt  ? Epilepsy Brother   ? Depression Brother   ?     with suicide attempts  ? Alcohol abuse Maternal Grandfather   ? Colon polyps Neg Hx   ? Colon cancer Neg Hx   ? Esophageal cancer Neg Hx   ? Rectal cancer Neg Hx   ? Stomach cancer Neg Hx   ? ? ?SOCIAL HISTORY: ?Social History  ? ?Socioeconomic History  ? Marital status: Single  ?   Spouse name: Not on file  ? Number of children: Not on file  ? Years of education: Not on file  ? Highest education level: Not on file  ?Occupational History  ? Not on file  ?Tobacco Use  ? Smoking status: Never  ? Smokeless tobacco: Never  ?Vaping Use  ? Vaping Use: Never used  ?Substance and Sexual Activity  ? Alcohol use: Yes  ?  Alcohol/week: 1.0 standard drink  ?  Types: 1 Standard drinks or equivalent per week  ? Drug use: No  ? Sexual activity: Yes  ?  Comment: N/A insurance questions  ?Other Topics Concern  ? Not on file  ?Social History Narrative  ? Works.  Moving to new apartment to be nearer her boyfriend.    ? ?Social Determinants of Health  ? ?Financial Resource Strain: Not on file  ?Food Insecurity: Not on file  ?Transportation Needs: Not on file  ?Physical Activity: Not on file  ?Stress: Not on file  ?Social Connections: Not on file  ?Intimate Partner Violence: Not on file  ? ? ?No Known Allergies ? ?Current Outpatient Medications  ?Medication Sig Dispense Refill  ? labetalol (NORMODYNE) 200 MG tablet Take 200 mg by mouth  2 (two) times daily.    ? rosuvastatin (CRESTOR) 10 MG tablet Take 1 tablet (10 mg total) by mouth daily. 90 tablet 3  ? valsartan (DIOVAN) 160 MG tablet Take 1 tablet (160 mg total) by mouth daily. 90 tablet 3  ? ?No current facility-administered medications for this visit.  ? ? ?REVIEW OF SYSTEMS:  ?'[X]'$  denotes positive finding, '[ ]'$  denotes negative finding ?Cardiac  Comments:  ?Chest pain or chest pressure:    ?Shortness of breath upon exertion:    ?Short of breath when lying flat:    ?Irregular heart rhythm:    ?    ?Vascular    ?Pain in calf, thigh, or hip brought on by ambulation:    ?Pain in feet at night that wakes you up from your sleep:     ?Blood clot in your veins:    ?Leg swelling:     ?    ?Pulmonary    ?Oxygen at home:    ?Productive cough:     ?Wheezing:     ?    ?Neurologic    ?Sudden weakness in arms or legs:     ?Sudden numbness in arms or legs:     ?Sudden onset  of difficulty speaking or slurred speech:    ?Temporary loss of vision in one eye:     ?Problems with dizziness:     ?    ?Gastrointestinal    ?Blood in stool:     ?Vomited blood:     ?    ?Genitourinary    ?Burning when urinating:     ?Blood in urine:    ?    ?Psychiatric    ?Major depression:     ?    ?Hematologic    ?Bleeding problems:    ?Problems with blood clotting too easily:    ?    ?Skin    ?Rashes or ulcers:    ?    ?Constitutional    ?Fever or chills:    ? ? ?PHYSICAL EXAM: ?Vitals:  ? 10/28/21 0955  ?BP: (!) 141/85  ?Pulse: 70  ?Resp: 14  ?Temp: 98.1 ?F (36.7 ?C)  ?TempSrc: Temporal  ?SpO2: 98%  ?Weight: 117 lb (53.1 kg)  ?Height: '5\' 4"'$  (1.626 m)  ? ? ?GENERAL: The patient is a well-nourished female, in no acute distress. The vital signs are documented above. ?CARDIAC: There is a regular rate and rhythm.  ?VASCULAR:  ?Palpable radial pulses bilaterally ?Palpable femoral pulses bilaterally ?Palpable PT pulses bilaterally ?PULMONARY: No respiratory distress. ?ABDOMEN: Soft and non-tender. ?MUSCULOSKELETAL: There are no major deformities or cyanosis. ?NEUROLOGIC: No focal weakness or paresthesias are detected. ?SKIN: There are no ulcers or rashes noted. ?PSYCHIATRIC: The patient has a normal affect. ? ?DATA:  ? ? ? ? ?Assessment/Plan: ? ?56 year old female presents for evaluation of possible renal artery stenting in the setting of hypertension and stage III CKD with evidence of mild renal artery stenosis on the right by [place] as depicted above.  I reviewed the patient's ultrasound that does not demonstrate any hemodynamically significant or critical stenosis.  I discussed that current guidelines indicate individuals with greater than 60% stenosis benefit from renal artery stenting.  Her right renal artery velocity and renal artery to aortic ratio do not indicate critical stenosis (>60%) although she does have evidence of mild disease.  I recommended continue medical management and I will see her again in 6  months with renal artery duplex here in the office.  She is already  on an ARB which would be appropriate.  Discussed she call with questions or concerns.  I did discuss the CORAL trial with her as well. ? ? ?Marty Heck, MD ?[facility] ?Office: [phone] ? ? ? ? ?

## 2021-10-30 ENCOUNTER — Other Ambulatory Visit (HOSPITAL_COMMUNITY): Payer: Self-pay

## 2021-10-30 MED ORDER — LABETALOL HCL 200 MG PO TABS
200.0000 mg | ORAL_TABLET | Freq: Two times a day (BID) | ORAL | 0 refills | Status: DC
  Filled 2021-11-10: qty 60, 30d supply, fill #0

## 2021-11-04 ENCOUNTER — Other Ambulatory Visit: Payer: Self-pay | Admitting: *Deleted

## 2021-11-04 DIAGNOSIS — I701 Atherosclerosis of renal artery: Secondary | ICD-10-CM

## 2021-11-10 ENCOUNTER — Other Ambulatory Visit (HOSPITAL_COMMUNITY): Payer: Self-pay

## 2021-11-17 ENCOUNTER — Other Ambulatory Visit (HOSPITAL_COMMUNITY): Payer: Self-pay

## 2021-11-18 ENCOUNTER — Telehealth: Payer: Self-pay

## 2021-11-18 ENCOUNTER — Other Ambulatory Visit: Payer: Self-pay | Admitting: Emergency Medicine

## 2021-11-18 ENCOUNTER — Other Ambulatory Visit (HOSPITAL_COMMUNITY): Payer: Self-pay

## 2021-11-18 MED ORDER — VALSARTAN 320 MG PO TABS
320.0000 mg | ORAL_TABLET | Freq: Every day | ORAL | 3 refills | Status: DC
  Filled 2021-11-18 – 2021-11-28 (×2): qty 90, 90d supply, fill #0
  Filled 2022-02-21: qty 90, 90d supply, fill #1
  Filled 2022-05-29: qty 30, 30d supply, fill #2
  Filled 2022-06-28: qty 30, 30d supply, fill #3
  Filled 2022-07-27: qty 30, 30d supply, fill #4
  Filled 2022-08-25: qty 30, 30d supply, fill #5
  Filled 2022-09-26: qty 30, 30d supply, fill #6
  Filled 2022-10-26: qty 30, 30d supply, fill #7

## 2021-11-18 NOTE — Telephone Encounter (Signed)
Pt is asking for a refill on  ?valsartan (DIOVAN) 160 MG tablet ? ?Pt has been having to take 320 mg as discussed at last visit which resulted in the patient running out of the medication sooner than the pharmacy has noted to refill.  ? ?Pharmacy ?[facility] ?

## 2021-11-18 NOTE — Telephone Encounter (Signed)
New prescription for valsartan 320 mg sent to pharmacy requested.  Thanks.

## 2021-11-18 NOTE — Telephone Encounter (Signed)
Called patient to inform her that her rx was sent to pharmacy on file  ?

## 2021-11-26 ENCOUNTER — Other Ambulatory Visit (HOSPITAL_COMMUNITY): Payer: Self-pay

## 2021-11-28 ENCOUNTER — Other Ambulatory Visit (HOSPITAL_COMMUNITY): Payer: Self-pay

## 2021-12-10 ENCOUNTER — Other Ambulatory Visit (HOSPITAL_COMMUNITY): Payer: Self-pay

## 2021-12-10 MED ORDER — LABETALOL HCL 200 MG PO TABS
ORAL_TABLET | ORAL | 3 refills | Status: DC
  Filled 2022-03-11: qty 60, 30d supply, fill #0
  Filled 2022-05-12: qty 60, 30d supply, fill #1
  Filled 2022-06-12: qty 60, 30d supply, fill #2
  Filled 2022-07-07: qty 60, 30d supply, fill #3

## 2021-12-10 MED ORDER — LABETALOL HCL 200 MG PO TABS
ORAL_TABLET | ORAL | 3 refills | Status: DC
  Filled 2021-12-10: qty 60, 30d supply, fill #0
  Filled 2022-01-05: qty 60, 30d supply, fill #1
  Filled 2022-02-09: qty 60, 30d supply, fill #2
  Filled 2022-03-09 – 2022-04-06 (×2): qty 60, 30d supply, fill #3

## 2021-12-30 ENCOUNTER — Other Ambulatory Visit (HOSPITAL_COMMUNITY): Payer: Self-pay

## 2021-12-31 ENCOUNTER — Ambulatory Visit (INDEPENDENT_AMBULATORY_CARE_PROVIDER_SITE_OTHER): Payer: 59 | Admitting: Emergency Medicine

## 2021-12-31 ENCOUNTER — Encounter: Payer: Self-pay | Admitting: Emergency Medicine

## 2021-12-31 VITALS — BP 132/74 | HR 79 | Temp 98.3°F | Ht 64.0 in | Wt 118.5 lb

## 2021-12-31 DIAGNOSIS — I10 Essential (primary) hypertension: Secondary | ICD-10-CM | POA: Diagnosis not present

## 2021-12-31 DIAGNOSIS — N1832 Chronic kidney disease, stage 3b: Secondary | ICD-10-CM

## 2021-12-31 DIAGNOSIS — E785 Hyperlipidemia, unspecified: Secondary | ICD-10-CM | POA: Diagnosis not present

## 2021-12-31 NOTE — Assessment & Plan Note (Signed)
Well-controlled hypertension. Continue labetalol 200 mg twice a day and valsartan 320 mg daily. Dietary approaches to stop hypertension discussed.

## 2021-12-31 NOTE — Progress Notes (Signed)
Robin Henderson 56 y.o.   Chief Complaint  Patient presents with   Follow-up    No concerns    HISTORY OF PRESENT ILLNESS: This is a 56 y.o. female A1A here for 56-monthfollow-up of hypertension. Doing well.  Presently on labetalol 200 mg twice a day and valsartan 320 mg once a day. Most readings at home within normal limits. BP Readings from Last 3 Encounters:  12/31/21 132/74  10/28/21 (!) 141/85  10/01/21 (!) 220/108  Did follow-up with vascular surgeon with the following recommendations: Assessment/Plan:   56year old female presents for evaluation of possible renal artery stenting in the setting of hypertension and stage III CKD with evidence of mild renal artery stenosis on the right by [place] depicted above.  I reviewed the patient's ultrasound that does not demonstrate any hemodynamically significant or critical stenosis.  I discussed that current guidelines indicate individuals with greater than 60% stenosis benefit from renal artery stenting.  Her right renal artery velocity and renal artery to aortic ratio do not indicate critical stenosis (>60%) although she does have evidence of mild disease.  I recommended continue medical management and I will see her again in 6 months with renal artery duplex here in the office.  She is already on an ARB which would be appropriate.  Discussed she call with questions or concerns.  I did discuss the CORAL trial with her as well.     CMarty Heck MD [facility]: [phone]  HPI   Prior to Admission medications   Medication Sig Start Date End Date Taking? Authorizing Provider  labetalol (NORMODYNE) 200 MG tablet Take 200 mg by mouth 2 (two) times daily.   Yes [provider]  labetalol (NORMODYNE) 200 MG tablet Take 1 tablet by mouth 2 times a day 12/10/21  Yes   labetalol (NORMODYNE) 200 MG tablet Take 1 tablet by mouth 2 times a day 12/10/21  Yes   rosuvastatin (CRESTOR) 10 MG  tablet Take 1 tablet (10 mg total) by mouth daily. 07/03/21  Yes Zaevion Parke, MInes Bloomer MD  valsartan (DIOVAN) 320 MG tablet Take 1 tablet by mouth daily. 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  Almost slit wrists and almost jumped off a parking deck    Past Surgical History:  Procedure Laterality Date   COLONOSCOPY  02/2021   EXTERNAL EAR SURGERY  1995   correct earring hole   HEAD & NECK SKIN LESION EXCISIONAL BIOPSY  1995   lip lesion  WISDOM TOOTH EXTRACTION      Social History   Socioeconomic History   Marital status: Single    Spouse name: Not on file   Number of children: Not on file   Years of education: Not on file   Highest education level: Not on file  Occupational History   Not on file  Tobacco Use   Smoking status: Never   Smokeless tobacco: Never  Vaping Use   Vaping Use: Never used  Substance and Sexual Activity   Alcohol use: Yes    Alcohol/week: 1.0 standard drink of alcohol    Types: 1 Standard drinks or equivalent per week   Drug use: No    Sexual activity: Yes    Comment: N/A insurance questions  Other Topics Concern   Not on file  Social History Narrative   Works.  Moving to new apartment to be nearer her boyfriend.     Social Determinants of Health   Financial Resource Strain: Not on file  Food Insecurity: Not on file  Transportation Needs: Not on file  Physical Activity: Not on file  Stress: Not on file  Social Connections: Not on file  Intimate Partner Violence: Not on file    Family History  Problem Relation Age of Onset   Heart disease Mother    Alcohol abuse Mother    Epilepsy Sister    Depression Sister        possible suicide attempt   Epilepsy Brother    Depression Brother        with suicide attempts   Alcohol abuse Maternal Grandfather    Colon polyps Neg Hx    Colon cancer Neg Hx    Esophageal cancer Neg Hx    Rectal cancer Neg Hx    Stomach cancer Neg Hx      Review of Systems  Constitutional: Negative.  Negative for chills and fever.  HENT: Negative.  Negative for congestion and sore throat.   Respiratory: Negative.  Negative for cough and shortness of breath.   Cardiovascular: Negative.  Negative for chest pain and palpitations.  Gastrointestinal:  Negative for abdominal pain, diarrhea, nausea and vomiting.  Genitourinary: Negative.  Negative for dysuria and hematuria.  Skin: Negative.  Negative for rash.  Neurological:  Negative for dizziness and headaches.  All other systems reviewed and are negative.  Today's Vitals   12/31/21 0957  BP: 132/74  Pulse: 79  Temp: 98.3 F (36.8 C)  TempSrc: Oral  SpO2: 99%  Weight: 118 lb 8 oz (53.8 kg)  Height: '5\' 4"'$  (1.626 m)   Body mass index is 20.34 kg/m.   Physical Exam Vitals reviewed.  Constitutional:      Appearance: Normal appearance.  HENT:     Head: Normocephalic.  Eyes:     Extraocular Movements: Extraocular movements intact.     Pupils: Pupils are equal, round, and reactive to light.  Cardiovascular:     Rate and  Rhythm: Normal rate.  Pulmonary:     Effort: Pulmonary effort is normal.  Musculoskeletal:     Right lower leg: No edema.     Left lower leg: No edema.  Skin:    General: Skin is warm and dry.     Capillary Refill: Capillary refill takes less than 2 seconds.  Neurological:     General: No focal deficit present.     Mental Status: She is alert and oriented to person, place, and time.  Psychiatric:  Mood and Affect: Mood normal.        Behavior: Behavior normal.      ASSESSMENT & PLAN: A total of 45 minutes was spent with the patient and counseling/coordination of care regarding preparing for this visit, review of most recent office visit note, review of most recent vascular surgeon office visit notes, review of all medications, review of most recent blood work results, review of multiple chronic medical problems and their management, cardiovascular risks associated with hypertension and dyslipidemia, education on nutrition, prognosis, documentation.  Problem List Items Addressed This Visit       Cardiovascular and Mediastinum   Essential hypertension - Primary    Well-controlled hypertension. Continue labetalol 200 mg twice a day and valsartan 320 mg daily. Dietary approaches to stop hypertension discussed.        Genitourinary   Stage 3b chronic kidney disease ([place])    Advised to stay well-hydrated and avoid NSAIDs. Diet and nutrition discussed.        Other   Dyslipidemia    Stable.  Diet and nutrition discussed. Continue rosuvastatin 10 mg daily. The 10-year ASCVD risk score (Arnett DK, et al., 2019) is: 2.2%   Values used to calculate the score:     Age: 71 years     Sex: Female     Is Non-Hispanic African American: No     Diabetic: No     Tobacco smoker: No     Systolic Blood Pressure: 604 mmHg     Is BP treated: Yes     HDL Cholesterol: 98.5 mg/dL     Total Cholesterol: 295 mg/dL       Patient Instructions  Hypertension, Adult High blood  pressure (hypertension) is when the force of blood pumping through the arteries is too strong. The arteries are the blood vessels that carry blood from the heart throughout the body. Hypertension forces the heart to work harder to pump blood and may cause arteries to become narrow or stiff. Untreated or uncontrolled hypertension can lead to a heart attack, heart failure, a stroke, kidney disease, and other problems. A blood pressure reading consists of a higher number over a lower number. Ideally, your blood pressure should be below 120/80. The first ("top") number is called the systolic pressure. It is a measure of the pressure in your arteries as your heart beats. The second ("bottom") number is called the diastolic pressure. It is a measure of the pressure in your arteries as the heart relaxes. What are the causes? The exact cause of this condition is not known. There are some conditions that result in high blood pressure. What increases the risk? Certain factors may make you more likely to develop high blood pressure. Some of these risk factors are under your control, including: Smoking. Not getting enough exercise or physical activity. Being overweight. Having too much fat, sugar, calories, or salt (sodium) in your diet. Drinking too much alcohol. Other risk factors include: Having a personal history of heart disease, diabetes, high cholesterol, or kidney disease. Stress. Having a family history of high blood pressure and high cholesterol. Having obstructive sleep apnea. Age. The risk increases with age. What are the signs or symptoms? High blood pressure may not cause symptoms. Very high blood pressure (hypertensive crisis) may cause: Headache. Fast or irregular heartbeats (palpitations). Shortness of breath. Nosebleed. Nausea and vomiting. Vision changes. Severe chest pain, dizziness, and seizures. How is this diagnosed? This condition is diagnosed by measuring your blood pressure  while you are  seated, with your arm resting on a flat surface, your legs uncrossed, and your feet flat on the floor. The cuff of the blood pressure monitor will be placed directly against the skin of your upper arm at the level of your heart. Blood pressure should be measured at least twice using the same arm. Certain conditions can cause a difference in blood pressure between your right and left arms. If you have a high blood pressure reading during one visit or you have normal blood pressure with other risk factors, you may be asked to: Return on a different day to have your blood pressure checked again. Monitor your blood pressure at home for 1 week or longer. If you are diagnosed with hypertension, you may have other blood or imaging tests to help your health care provider understand your overall risk for other conditions. How is this treated? This condition is treated by making healthy lifestyle changes, such as eating healthy foods, exercising more, and reducing your alcohol intake. You may be referred for counseling on a healthy diet and physical activity. Your health care provider may prescribe medicine if lifestyle changes are not enough to get your blood pressure under control and if: Your systolic blood pressure is above 130. Your diastolic blood pressure is above 80. Your personal target blood pressure may vary depending on your medical conditions, your age, and other factors. Follow these instructions at home: Eating and drinking  Eat a diet that is high in fiber and potassium, and low in sodium, added sugar, and fat. An example of this eating plan is called the DASH diet. DASH stands for Dietary Approaches to Stop Hypertension. To eat this way: Eat plenty of fresh fruits and vegetables. Try to fill one half of your plate at each meal with fruits and vegetables. Eat whole grains, such as whole-wheat pasta, brown rice, or whole-grain bread. Fill about one fourth of your plate with whole  grains. Eat or drink low-fat dairy products, such as skim milk or low-fat yogurt. Avoid fatty cuts of meat, processed or cured meats, and poultry with skin. Fill about one fourth of your plate with lean proteins, such as fish, chicken without skin, beans, eggs, or tofu. Avoid pre-made and processed foods. These tend to be higher in sodium, added sugar, and fat. Reduce your daily sodium intake. Many people with hypertension should eat less than 1,500 mg of sodium a day. Do not drink alcohol if: Your health care provider tells you not to drink. You are pregnant, may be pregnant, or are planning to become pregnant. If you drink alcohol: Limit how much you have to: 0-1 drink a day for women. 0-2 drinks a day for men. Know how much alcohol is in your drink. In the [place] one drink equals one 12 oz bottle of beer (355 mL), one 5 oz glass of wine (148 mL), or one 1 oz glass of hard liquor (44 mL). Lifestyle  Work with your health care provider to maintain a healthy body weight or to lose weight. Ask what an ideal weight is for you. Get at least 30 minutes of exercise that causes your heart to beat faster (aerobic exercise) most days of the week. Activities may include walking, swimming, or biking. Include exercise to strengthen your muscles (resistance exercise), such as Pilates or lifting weights, as part of your weekly exercise routine. Try to do these types of exercises for 30 minutes at least 3 days a week. Do not use any products that contain nicotine  or tobacco. These products include cigarettes, chewing tobacco, and vaping devices, such as e-cigarettes. If you need help quitting, ask your health care provider. Monitor your blood pressure at home as told by your health care provider. Keep all follow-up visits. This is important. Medicines Take over-the-counter and prescription medicines only as told by your health care provider. Follow directions carefully. Blood pressure medicines must be taken  as prescribed. Do not skip doses of blood pressure medicine. Doing this puts you at risk for problems and can make the medicine less effective. Ask your health care provider about side effects or reactions to medicines that you should watch for. Contact a health care provider if you: Think you are having a reaction to a medicine you are taking. Have headaches that keep coming back (recurring). Feel dizzy. Have swelling in your ankles. Have trouble with your vision. Get help right away if you: Develop a severe headache or confusion. Have unusual weakness or numbness. Feel faint. Have severe pain in your chest or abdomen. Vomit repeatedly. Have trouble breathing. These symptoms may be an emergency. Get help right away. Call 911. Do not wait to see if the symptoms will go away. Do not drive yourself to the hospital. Summary Hypertension is when the force of blood pumping through your arteries is too strong. If this condition is not controlled, it may put you at risk for serious complications. Your personal target blood pressure may vary depending on your medical conditions, your age, and other factors. For most people, a normal blood pressure is less than 120/80. Hypertension is treated with lifestyle changes, medicines, or a combination of both. Lifestyle changes include losing weight, eating a healthy, low-sodium diet, exercising more, and limiting alcohol. This information is not intended to replace advice given to you by your health care provider. Make sure you discuss any questions you have with your health care provider. Document Revised: 04/29/2021 Document Reviewed: 04/29/2021 Elsevier Patient Education  [place], MD [facility]

## 2021-12-31 NOTE — Assessment & Plan Note (Signed)
Stable.  Diet and nutrition discussed. Continue rosuvastatin 10 mg daily. The 10-year ASCVD risk score (Arnett DK, et al., 2019) is: 2.2%   Values used to calculate the score:     Age: 56 years     Sex: Female     Is Non-Hispanic African American: No     Diabetic: No     Tobacco smoker: No     Systolic Blood Pressure: 257 mmHg     Is BP treated: Yes     HDL Cholesterol: 98.5 mg/dL     Total Cholesterol: 295 mg/dL

## 2021-12-31 NOTE — Assessment & Plan Note (Signed)
Advised to stay well-hydrated and avoid NSAIDs. Diet and nutrition discussed.

## 2021-12-31 NOTE — Patient Instructions (Signed)
Hypertension, Adult High blood pressure (hypertension) is when the force of blood pumping through the arteries is too strong. The arteries are the blood vessels that carry blood from the heart throughout the body. Hypertension forces the heart to work harder to pump blood and may cause arteries to become narrow or stiff. Untreated or uncontrolled hypertension can lead to a heart attack, heart failure, a stroke, kidney disease, and other problems. A blood pressure reading consists of a higher number over a lower number. Ideally, your blood pressure should be below 120/80. The first ("top") number is called the systolic pressure. It is a measure of the pressure in your arteries as your heart beats. The second ("bottom") number is called the diastolic pressure. It is a measure of the pressure in your arteries as the heart relaxes. What are the causes? The exact cause of this condition is not known. There are some conditions that result in high blood pressure. What increases the risk? Certain factors may make you more likely to develop high blood pressure. Some of these risk factors are under your control, including: Smoking. Not getting enough exercise or physical activity. Being overweight. Having too much fat, sugar, calories, or salt (sodium) in your diet. Drinking too much alcohol. Other risk factors include: Having a personal history of heart disease, diabetes, high cholesterol, or kidney disease. Stress. Having a family history of high blood pressure and high cholesterol. Having obstructive sleep apnea. Age. The risk increases with age. What are the signs or symptoms? High blood pressure may not cause symptoms. Very high blood pressure (hypertensive crisis) may cause: Headache. Fast or irregular heartbeats (palpitations). Shortness of breath. Nosebleed. Nausea and vomiting. Vision changes. Severe chest pain, dizziness, and seizures. How is this diagnosed? This condition is diagnosed by  measuring your blood pressure while you are seated, with your arm resting on a flat surface, your legs uncrossed, and your feet flat on the floor. The cuff of the blood pressure monitor will be placed directly against the skin of your upper arm at the level of your heart. Blood pressure should be measured at least twice using the same arm. Certain conditions can cause a difference in blood pressure between your right and left arms. If you have a high blood pressure reading during one visit or you have normal blood pressure with other risk factors, you may be asked to: Return on a different day to have your blood pressure checked again. Monitor your blood pressure at home for 1 week or longer. If you are diagnosed with hypertension, you may have other blood or imaging tests to help your health care provider understand your overall risk for other conditions. How is this treated? This condition is treated by making healthy lifestyle changes, such as eating healthy foods, exercising more, and reducing your alcohol intake. You may be referred for counseling on a healthy diet and physical activity. Your health care provider may prescribe medicine if lifestyle changes are not enough to get your blood pressure under control and if: Your systolic blood pressure is above 130. Your diastolic blood pressure is above 80. Your personal target blood pressure may vary depending on your medical conditions, your age, and other factors. Follow these instructions at home: Eating and drinking  Eat a diet that is high in fiber and potassium, and low in sodium, added sugar, and fat. An example of this eating plan is called the DASH diet. DASH stands for Dietary Approaches to Stop Hypertension. To eat this way: Eat   plenty of fresh fruits and vegetables. Try to fill one half of your plate at each meal with fruits and vegetables. Eat whole grains, such as whole-wheat pasta, brown rice, or whole-grain bread. Fill about one  fourth of your plate with whole grains. Eat or drink low-fat dairy products, such as skim milk or low-fat yogurt. Avoid fatty cuts of meat, processed or cured meats, and poultry with skin. Fill about one fourth of your plate with lean proteins, such as fish, chicken without skin, beans, eggs, or tofu. Avoid pre-made and processed foods. These tend to be higher in sodium, added sugar, and fat. Reduce your daily sodium intake. Many people with hypertension should eat less than 1,500 mg of sodium a day. Do not drink alcohol if: Your health care provider tells you not to drink. You are pregnant, may be pregnant, or are planning to become pregnant. If you drink alcohol: Limit how much you have to: 0-1 drink a day for women. 0-2 drinks a day for men. Know how much alcohol is in your drink. In the [place] one drink equals one 12 oz bottle of beer (355 mL), one 5 oz glass of wine (148 mL), or one 1 oz glass of hard liquor (44 mL). Lifestyle  Work with your health care provider to maintain a healthy body weight or to lose weight. Ask what an ideal weight is for you. Get at least 30 minutes of exercise that causes your heart to beat faster (aerobic exercise) most days of the week. Activities may include walking, swimming, or biking. Include exercise to strengthen your muscles (resistance exercise), such as Pilates or lifting weights, as part of your weekly exercise routine. Try to do these types of exercises for 30 minutes at least 3 days a week. Do not use any products that contain nicotine or tobacco. These products include cigarettes, chewing tobacco, and vaping devices, such as e-cigarettes. If you need help quitting, ask your health care provider. Monitor your blood pressure at home as told by your health care provider. Keep all follow-up visits. This is important. Medicines Take over-the-counter and prescription medicines only as told by your health care provider. Follow directions carefully. Blood  pressure medicines must be taken as prescribed. Do not skip doses of blood pressure medicine. Doing this puts you at risk for problems and can make the medicine less effective. Ask your health care provider about side effects or reactions to medicines that you should watch for. Contact a health care provider if you: Think you are having a reaction to a medicine you are taking. Have headaches that keep coming back (recurring). Feel dizzy. Have swelling in your ankles. Have trouble with your vision. Get help right away if you: Develop a severe headache or confusion. Have unusual weakness or numbness. Feel faint. Have severe pain in your chest or abdomen. Vomit repeatedly. Have trouble breathing. These symptoms may be an emergency. Get help right away. Call 911. Do not wait to see if the symptoms will go away. Do not drive yourself to the hospital. Summary Hypertension is when the force of blood pumping through your arteries is too strong. If this condition is not controlled, it may put you at risk for serious complications. Your personal target blood pressure may vary depending on your medical conditions, your age, and other factors. For most people, a normal blood pressure is less than 120/80. Hypertension is treated with lifestyle changes, medicines, or a combination of both. Lifestyle changes include losing weight, eating a healthy,   low-sodium diet, exercising more, and limiting alcohol. This information is not intended to replace advice given to you by your health care provider. Make sure you discuss any questions you have with your health care provider. Document Revised: 04/29/2021 Document Reviewed: 04/29/2021 Elsevier Patient Education  2023 [employer]  

## 2022-01-05 ENCOUNTER — Other Ambulatory Visit (HOSPITAL_COMMUNITY): Payer: Self-pay

## 2022-01-16 ENCOUNTER — Ambulatory Visit (INDEPENDENT_AMBULATORY_CARE_PROVIDER_SITE_OTHER): Payer: 59

## 2022-01-16 DIAGNOSIS — Z23 Encounter for immunization: Secondary | ICD-10-CM

## 2022-01-27 ENCOUNTER — Other Ambulatory Visit: Payer: Self-pay | Admitting: Emergency Medicine

## 2022-01-27 DIAGNOSIS — N6310 Unspecified lump in the right breast, unspecified quadrant: Secondary | ICD-10-CM

## 2022-02-09 ENCOUNTER — Other Ambulatory Visit (HOSPITAL_COMMUNITY): Payer: Self-pay

## 2022-02-21 ENCOUNTER — Other Ambulatory Visit (HOSPITAL_COMMUNITY): Payer: Self-pay

## 2022-03-11 ENCOUNTER — Ambulatory Visit
Admission: RE | Admit: 2022-03-11 | Discharge: 2022-03-11 | Disposition: A | Discharge status: Home / Self Care | Payer: 59 | Source: Ambulatory Visit | Attending: Emergency Medicine | Admitting: Emergency Medicine

## 2022-03-11 ENCOUNTER — Other Ambulatory Visit (HOSPITAL_COMMUNITY): Payer: Self-pay

## 2022-03-11 DIAGNOSIS — N6313 Unspecified lump in the right breast, lower outer quadrant: Secondary | ICD-10-CM | POA: Diagnosis not present

## 2022-03-11 DIAGNOSIS — N6311 Unspecified lump in the right breast, upper outer quadrant: Secondary | ICD-10-CM | POA: Diagnosis not present

## 2022-03-11 DIAGNOSIS — R922 Inconclusive mammogram: Secondary | ICD-10-CM | POA: Diagnosis not present

## 2022-03-11 DIAGNOSIS — N6310 Unspecified lump in the right breast, unspecified quadrant: Secondary | ICD-10-CM

## 2022-03-12 ENCOUNTER — Other Ambulatory Visit (HOSPITAL_COMMUNITY): Payer: Self-pay

## 2022-03-26 ENCOUNTER — Other Ambulatory Visit (HOSPITAL_COMMUNITY): Payer: Self-pay

## 2022-03-31 ENCOUNTER — Other Ambulatory Visit (HOSPITAL_COMMUNITY): Payer: Self-pay

## 2022-04-01 ENCOUNTER — Other Ambulatory Visit (HOSPITAL_COMMUNITY): Payer: Self-pay

## 2022-04-02 ENCOUNTER — Other Ambulatory Visit (HOSPITAL_COMMUNITY): Payer: Self-pay

## 2022-04-02 ENCOUNTER — Ambulatory Visit (INDEPENDENT_AMBULATORY_CARE_PROVIDER_SITE_OTHER): Payer: 59 | Admitting: Emergency Medicine

## 2022-04-02 ENCOUNTER — Encounter: Payer: Self-pay | Admitting: Emergency Medicine

## 2022-04-02 VITALS — BP 130/70 | HR 70 | Temp 98.3°F | Ht 64.0 in | Wt 117.0 lb

## 2022-04-02 DIAGNOSIS — I10 Essential (primary) hypertension: Secondary | ICD-10-CM | POA: Diagnosis not present

## 2022-04-02 DIAGNOSIS — E785 Hyperlipidemia, unspecified: Secondary | ICD-10-CM | POA: Diagnosis not present

## 2022-04-02 NOTE — Patient Instructions (Signed)
Hypertension, Adult High blood pressure (hypertension) is when the force of blood pumping through the arteries is too strong. The arteries are the blood vessels that carry blood from the heart throughout the body. Hypertension forces the heart to work harder to pump blood and may cause arteries to become narrow or stiff. Untreated or uncontrolled hypertension can lead to a heart attack, heart failure, a stroke, kidney disease, and other problems. A blood pressure reading consists of a higher number over a lower number. Ideally, your blood pressure should be below 120/80. The first ("top") number is called the systolic pressure. It is a measure of the pressure in your arteries as your heart beats. The second ("bottom") number is called the diastolic pressure. It is a measure of the pressure in your arteries as the heart relaxes. What are the causes? The exact cause of this condition is not known. There are some conditions that result in high blood pressure. What increases the risk? Certain factors may make you more likely to develop high blood pressure. Some of these risk factors are under your control, including: Smoking. Not getting enough exercise or physical activity. Being overweight. Having too much fat, sugar, calories, or salt (sodium) in your diet. Drinking too much alcohol. Other risk factors include: Having a personal history of heart disease, diabetes, high cholesterol, or kidney disease. Stress. Having a family history of high blood pressure and high cholesterol. Having obstructive sleep apnea. Age. The risk increases with age. What are the signs or symptoms? High blood pressure may not cause symptoms. Very high blood pressure (hypertensive crisis) may cause: Headache. Fast or irregular heartbeats (palpitations). Shortness of breath. Nosebleed. Nausea and vomiting. Vision changes. Severe chest pain, dizziness, and seizures. How is this diagnosed? This condition is diagnosed by  measuring your blood pressure while you are seated, with your arm resting on a flat surface, your legs uncrossed, and your feet flat on the floor. The cuff of the blood pressure monitor will be placed directly against the skin of your upper arm at the level of your heart. Blood pressure should be measured at least twice using the same arm. Certain conditions can cause a difference in blood pressure between your right and left arms. If you have a high blood pressure reading during one visit or you have normal blood pressure with other risk factors, you may be asked to: Return on a different day to have your blood pressure checked again. Monitor your blood pressure at home for 1 week or longer. If you are diagnosed with hypertension, you may have other blood or imaging tests to help your health care provider understand your overall risk for other conditions. How is this treated? This condition is treated by making healthy lifestyle changes, such as eating healthy foods, exercising more, and reducing your alcohol intake. You may be referred for counseling on a healthy diet and physical activity. Your health care provider may prescribe medicine if lifestyle changes are not enough to get your blood pressure under control and if: Your systolic blood pressure is above 130. Your diastolic blood pressure is above 80. Your personal target blood pressure may vary depending on your medical conditions, your age, and other factors. Follow these instructions at home: Eating and drinking  Eat a diet that is high in fiber and potassium, and low in sodium, added sugar, and fat. An example of this eating plan is called the DASH diet. DASH stands for Dietary Approaches to Stop Hypertension. To eat this way: Eat   plenty of fresh fruits and vegetables. Try to fill one half of your plate at each meal with fruits and vegetables. Eat whole grains, such as whole-wheat pasta, brown rice, or whole-grain bread. Fill about one  fourth of your plate with whole grains. Eat or drink low-fat dairy products, such as skim milk or low-fat yogurt. Avoid fatty cuts of meat, processed or cured meats, and poultry with skin. Fill about one fourth of your plate with lean proteins, such as fish, chicken without skin, beans, eggs, or tofu. Avoid pre-made and processed foods. These tend to be higher in sodium, added sugar, and fat. Reduce your daily sodium intake. Many people with hypertension should eat less than 1,500 mg of sodium a day. Do not drink alcohol if: Your health care provider tells you not to drink. You are pregnant, may be pregnant, or are planning to become pregnant. If you drink alcohol: Limit how much you have to: 0-1 drink a day for women. 0-2 drinks a day for men. Know how much alcohol is in your drink. In the [place] one drink equals one 12 oz bottle of beer (355 mL), one 5 oz glass of wine (148 mL), or one 1 oz glass of hard liquor (44 mL). Lifestyle  Work with your health care provider to maintain a healthy body weight or to lose weight. Ask what an ideal weight is for you. Get at least 30 minutes of exercise that causes your heart to beat faster (aerobic exercise) most days of the week. Activities may include walking, swimming, or biking. Include exercise to strengthen your muscles (resistance exercise), such as Pilates or lifting weights, as part of your weekly exercise routine. Try to do these types of exercises for 30 minutes at least 3 days a week. Do not use any products that contain nicotine or tobacco. These products include cigarettes, chewing tobacco, and vaping devices, such as e-cigarettes. If you need help quitting, ask your health care provider. Monitor your blood pressure at home as told by your health care provider. Keep all follow-up visits. This is important. Medicines Take over-the-counter and prescription medicines only as told by your health care provider. Follow directions carefully. Blood  pressure medicines must be taken as prescribed. Do not skip doses of blood pressure medicine. Doing this puts you at risk for problems and can make the medicine less effective. Ask your health care provider about side effects or reactions to medicines that you should watch for. Contact a health care provider if you: Think you are having a reaction to a medicine you are taking. Have headaches that keep coming back (recurring). Feel dizzy. Have swelling in your ankles. Have trouble with your vision. Get help right away if you: Develop a severe headache or confusion. Have unusual weakness or numbness. Feel faint. Have severe pain in your chest or abdomen. Vomit repeatedly. Have trouble breathing. These symptoms may be an emergency. Get help right away. Call 911. Do not wait to see if the symptoms will go away. Do not drive yourself to the hospital. Summary Hypertension is when the force of blood pumping through your arteries is too strong. If this condition is not controlled, it may put you at risk for serious complications. Your personal target blood pressure may vary depending on your medical conditions, your age, and other factors. For most people, a normal blood pressure is less than 120/80. Hypertension is treated with lifestyle changes, medicines, or a combination of both. Lifestyle changes include losing weight, eating a healthy,   low-sodium diet, exercising more, and limiting alcohol. This information is not intended to replace advice given to you by your health care provider. Make sure you discuss any questions you have with your health care provider. Document Revised: 04/29/2021 Document Reviewed: 04/29/2021 Elsevier Patient Education  2023 [employer]  

## 2022-04-02 NOTE — Progress Notes (Signed)
Robin Henderson 56 y.o.   Chief Complaint  Patient presents with   Follow-up    72mth f/u ppt, no concerns     HISTORY OF PRESENT ILLNESS: This is a 56y.o. female A1A here for 337-monthollow-up of hypertension. Overall doing well.  Compliant with medications.  HPI   Prior to Admission medications   Medication Sig Start Date End Date Taking? Authorizing Provider  labetalol (NORMODYNE) 200 MG tablet Take 200 mg by mouth 2 (two) times daily.   Yes [provider]  rosuvastatin (CRESTOR) 10 MG tablet Take 1 tablet (10 mg total) by mouth daily. 07/03/21  Yes Rigo Letts, MiInes BloomerMD  valsartan (DIOVAN) 320 MG tablet Take 1 tablet by mouth daily. 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   Macrocytosis without anemia 04/27/2013   Epilepsy ([facility]   Anxiety and depression    Suicide attempt ([facility]   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  Almost slit wrists and almost jumped off a parking deck    Past Surgical History:   Procedure Laterality Date   COLONOSCOPY  02/2021   EXTERNAL EAR SURGERY  1995   correct earring hole   HEAD & NECK SKIN LESION EXCISIONAL BIOPSY  1995   lip lesion   WISDOM TOOTH EXTRACTION      Social History   Socioeconomic History   Marital status: Single    Spouse name: Not on file   Number of children: Not on file   Years of education: Not on file   Highest education level: Not on file  Occupational History   Not on file  Tobacco Use   Smoking status: Never   Smokeless tobacco: Never  Vaping Use   Vaping Use: Never used  Substance and Sexual Activity   Alcohol use: Yes    Alcohol/week: 1.0 standard drink of alcohol    Types: 1 Standard drinks or equivalent per week   Drug use: No   Sexual activity: Yes    Comment: N/A insurance questions  Other Topics Concern   Not on file  Social History Narrative   Works.  Moving to new apartment to be nearer her boyfriend.     Social Determinants of Health   Financial Resource Strain: Not on file  Food Insecurity: Not on file  Transportation Needs: Not on file  Physical Activity: Not on file  Stress: Not on file  Social Connections: Not on file  Intimate Partner  Violence: Not on file    Family History  Problem Relation Age of Onset   Heart disease Mother    Alcohol abuse Mother    Epilepsy Sister    Depression Sister        possible suicide attempt   Epilepsy Brother    Depression Brother        with suicide attempts   Alcohol abuse Maternal Grandfather    Colon polyps Neg Hx    Colon cancer Neg Hx    Esophageal cancer Neg Hx    Rectal cancer Neg Hx    Stomach cancer Neg Hx      Review of Systems  Constitutional: Negative.  Negative for chills and fever.  HENT: Negative.  Negative for congestion and sore throat.   Respiratory: Negative.  Negative for cough and shortness of breath.   Cardiovascular: Negative.  Negative for chest pain and palpitations.  Gastrointestinal:  Negative for nausea and vomiting.   Genitourinary: Negative.  Negative for dysuria.  Musculoskeletal: Negative.   Skin: Negative.  Negative for rash.  Neurological: Negative.  Negative for dizziness and headaches.  All other systems reviewed and are negative.   Today's Vitals   04/02/22 1024  BP: (!) 142/84  Pulse: 70  Temp: 98.3 F (36.8 C)  TempSrc: Oral  SpO2: 93%  Weight: 117 lb (53.1 kg)  Height: '5\' 4"'$  (1.626 m)   Body mass index is 20.08 kg/m. Wt Readings from Last 3 Encounters:  04/02/22 117 lb (53.1 kg)  12/31/21 118 lb 8 oz (53.8 kg)  10/28/21 117 lb (53.1 kg)    Physical Exam Vitals reviewed.  Constitutional:      Appearance: Normal appearance.  HENT:     Head: Normocephalic.  Eyes:     Extraocular Movements: Extraocular movements intact.     Pupils: Pupils are equal, round, and reactive to light.  Cardiovascular:     Rate and Rhythm: Normal rate and regular rhythm.     Pulses: Normal pulses.     Heart sounds: Normal heart sounds.  Pulmonary:     Effort: Pulmonary effort is normal.     Breath sounds: Normal breath sounds.  Skin:    General: Skin is warm and dry.  Neurological:     General: No focal deficit present.     Mental Status: She is alert and oriented to person, place, and time.  Psychiatric:        Mood and Affect: Mood normal.        Behavior: Behavior normal.      ASSESSMENT & PLAN: A total of 42 minutes was spent with the patient and counseling/coordination of care regarding preparing for this visit, review of most recent office visit notes, review of most recent blood work results, review of all medications, review of chronic medical conditions and their management, prognosis, documentation, and need for follow-up  Problem List Items Addressed This Visit       Cardiovascular and Mediastinum   Essential hypertension    Well-controlled hypertension with normal blood pressure readings at home. Continue valsartan 320 mg, labetalol 200 mg twice a day Cardiovascular  risks associated with hypertension discussed.        Other   Dyslipidemia    Stable.  Continue rosuvastatin 10 mg daily. The 10-year ASCVD risk score (Arnett DK, et al., 2019) is: 2.1%   Values used to calculate the score:     Age: 56 years     Sex: Female  Is Non-Hispanic African American: No     Diabetic: No     Tobacco smoker: No     Systolic Blood Pressure: 867 mmHg     Is BP treated: Yes     HDL Cholesterol: 98.5 mg/dL     Total Cholesterol: 295 mg/dL       Patient Instructions  Hypertension, Adult High blood pressure (hypertension) is when the force of blood pumping through the arteries is too strong. The arteries are the blood vessels that carry blood from the heart throughout the body. Hypertension forces the heart to work harder to pump blood and may cause arteries to become narrow or stiff. Untreated or uncontrolled hypertension can lead to a heart attack, heart failure, a stroke, kidney disease, and other problems. A blood pressure reading consists of a higher number over a lower number. Ideally, your blood pressure should be below 120/80. The first ("top") number is called the systolic pressure. It is a measure of the pressure in your arteries as your heart beats. The second ("bottom") number is called the diastolic pressure. It is a measure of the pressure in your arteries as the heart relaxes. What are the causes? The exact cause of this condition is not known. There are some conditions that result in high blood pressure. What increases the risk? Certain factors may make you more likely to develop high blood pressure. Some of these risk factors are under your control, including: Smoking. Not getting enough exercise or physical activity. Being overweight. Having too much fat, sugar, calories, or salt (sodium) in your diet. Drinking too much alcohol. Other risk factors include: Having a personal history of heart disease, diabetes, high cholesterol, or kidney  disease. Stress. Having a family history of high blood pressure and high cholesterol. Having obstructive sleep apnea. Age. The risk increases with age. What are the signs or symptoms? High blood pressure may not cause symptoms. Very high blood pressure (hypertensive crisis) may cause: Headache. Fast or irregular heartbeats (palpitations). Shortness of breath. Nosebleed. Nausea and vomiting. Vision changes. Severe chest pain, dizziness, and seizures. How is this diagnosed? This condition is diagnosed by measuring your blood pressure while you are seated, with your arm resting on a flat surface, your legs uncrossed, and your feet flat on the floor. The cuff of the blood pressure monitor will be placed directly against the skin of your upper arm at the level of your heart. Blood pressure should be measured at least twice using the same arm. Certain conditions can cause a difference in blood pressure between your right and left arms. If you have a high blood pressure reading during one visit or you have normal blood pressure with other risk factors, you may be asked to: Return on a different day to have your blood pressure checked again. Monitor your blood pressure at home for 1 week or longer. If you are diagnosed with hypertension, you may have other blood or imaging tests to help your health care provider understand your overall risk for other conditions. How is this treated? This condition is treated by making healthy lifestyle changes, such as eating healthy foods, exercising more, and reducing your alcohol intake. You may be referred for counseling on a healthy diet and physical activity. Your health care provider may prescribe medicine if lifestyle changes are not enough to get your blood pressure under control and if: Your systolic blood pressure is above 130. Your diastolic blood pressure is above 80. Your personal target blood pressure may vary depending  on your medical conditions,  your age, and other factors. Follow these instructions at home: Eating and drinking  Eat a diet that is high in fiber and potassium, and low in sodium, added sugar, and fat. An example of this eating plan is called the DASH diet. DASH stands for Dietary Approaches to Stop Hypertension. To eat this way: Eat plenty of fresh fruits and vegetables. Try to fill one half of your plate at each meal with fruits and vegetables. Eat whole grains, such as whole-wheat pasta, brown rice, or whole-grain bread. Fill about one fourth of your plate with whole grains. Eat or drink low-fat dairy products, such as skim milk or low-fat yogurt. Avoid fatty cuts of meat, processed or cured meats, and poultry with skin. Fill about one fourth of your plate with lean proteins, such as fish, chicken without skin, beans, eggs, or tofu. Avoid pre-made and processed foods. These tend to be higher in sodium, added sugar, and fat. Reduce your daily sodium intake. Many people with hypertension should eat less than 1,500 mg of sodium a day. Do not drink alcohol if: Your health care provider tells you not to drink. You are pregnant, may be pregnant, or are planning to become pregnant. If you drink alcohol: Limit how much you have to: 0-1 drink a day for women. 0-2 drinks a day for men. Know how much alcohol is in your drink. In the [place] one drink equals one 12 oz bottle of beer (355 mL), one 5 oz glass of wine (148 mL), or one 1 oz glass of hard liquor (44 mL). Lifestyle  Work with your health care provider to maintain a healthy body weight or to lose weight. Ask what an ideal weight is for you. Get at least 30 minutes of exercise that causes your heart to beat faster (aerobic exercise) most days of the week. Activities may include walking, swimming, or biking. Include exercise to strengthen your muscles (resistance exercise), such as Pilates or lifting weights, as part of your weekly exercise routine. Try to do these types  of exercises for 30 minutes at least 3 days a week. Do not use any products that contain nicotine or tobacco. These products include cigarettes, chewing tobacco, and vaping devices, such as e-cigarettes. If you need help quitting, ask your health care provider. Monitor your blood pressure at home as told by your health care provider. Keep all follow-up visits. This is important. Medicines Take over-the-counter and prescription medicines only as told by your health care provider. Follow directions carefully. Blood pressure medicines must be taken as prescribed. Do not skip doses of blood pressure medicine. Doing this puts you at risk for problems and can make the medicine less effective. Ask your health care provider about side effects or reactions to medicines that you should watch for. Contact a health care provider if you: Think you are having a reaction to a medicine you are taking. Have headaches that keep coming back (recurring). Feel dizzy. Have swelling in your ankles. Have trouble with your vision. Get help right away if you: Develop a severe headache or confusion. Have unusual weakness or numbness. Feel faint. Have severe pain in your chest or abdomen. Vomit repeatedly. Have trouble breathing. These symptoms may be an emergency. Get help right away. Call 911. Do not wait to see if the symptoms will go away. Do not drive yourself to the hospital. Summary Hypertension is when the force of blood pumping through your arteries is too strong. If this condition  is not controlled, it may put you at risk for serious complications. Your personal target blood pressure may vary depending on your medical conditions, your age, and other factors. For most people, a normal blood pressure is less than 120/80. Hypertension is treated with lifestyle changes, medicines, or a combination of both. Lifestyle changes include losing weight, eating a healthy, low-sodium diet, exercising more, and limiting  alcohol. This information is not intended to replace advice given to you by your health care provider. Make sure you discuss any questions you have with your health care provider. Document Revised: 04/29/2021 Document Reviewed: 04/29/2021 Elsevier Patient Education  [place], [place] [facility]

## 2022-04-02 NOTE — Assessment & Plan Note (Signed)
Stable.  Continue rosuvastatin 10 mg daily. The 10-year ASCVD risk score (Arnett DK, et al., 2019) is: 2.1%   Values used to calculate the score:     Age: 56 years     Sex: Female     Is Non-Hispanic African American: No     Diabetic: No     Tobacco smoker: No     Systolic Blood Pressure: 409 mmHg     Is BP treated: Yes     HDL Cholesterol: 98.5 mg/dL     Total Cholesterol: 295 mg/dL

## 2022-04-02 NOTE — Assessment & Plan Note (Signed)
Well-controlled hypertension with normal blood pressure readings at home. Continue valsartan 320 mg, labetalol 200 mg twice a day Cardiovascular risks associated with hypertension discussed.

## 2022-04-09 ENCOUNTER — Other Ambulatory Visit (HOSPITAL_COMMUNITY): Payer: Self-pay

## 2022-04-30 ENCOUNTER — Other Ambulatory Visit (HOSPITAL_COMMUNITY): Payer: Self-pay

## 2022-05-04 ENCOUNTER — Other Ambulatory Visit (HOSPITAL_COMMUNITY): Payer: Self-pay

## 2022-05-13 ENCOUNTER — Other Ambulatory Visit (HOSPITAL_COMMUNITY): Payer: Self-pay

## 2022-05-14 ENCOUNTER — Other Ambulatory Visit (HOSPITAL_COMMUNITY): Payer: Self-pay

## 2022-05-29 ENCOUNTER — Other Ambulatory Visit (HOSPITAL_COMMUNITY): Payer: Self-pay

## 2022-06-12 ENCOUNTER — Other Ambulatory Visit (HOSPITAL_COMMUNITY): Payer: Self-pay

## 2022-06-28 ENCOUNTER — Other Ambulatory Visit: Payer: Self-pay | Admitting: Emergency Medicine

## 2022-06-28 DIAGNOSIS — E785 Hyperlipidemia, unspecified: Secondary | ICD-10-CM

## 2022-06-28 MED ORDER — ROSUVASTATIN CALCIUM 10 MG PO TABS
10.0000 mg | ORAL_TABLET | Freq: Every day | ORAL | 1 refills | Status: DC
  Filled 2022-06-28: qty 30, 30d supply, fill #0
  Filled 2022-07-27: qty 30, 30d supply, fill #1
  Filled 2022-08-25: qty 30, 30d supply, fill #2
  Filled 2022-09-26: qty 30, 30d supply, fill #3
  Filled 2022-10-26: qty 30, 30d supply, fill #4
  Filled 2022-11-12 – 2022-11-21 (×2): qty 30, 30d supply, fill #5

## 2022-06-30 ENCOUNTER — Other Ambulatory Visit: Payer: Self-pay

## 2022-06-30 ENCOUNTER — Other Ambulatory Visit (HOSPITAL_COMMUNITY): Payer: Self-pay

## 2022-07-02 ENCOUNTER — Ambulatory Visit: Payer: 59 | Admitting: Emergency Medicine

## 2022-07-07 ENCOUNTER — Other Ambulatory Visit: Payer: Self-pay

## 2022-07-08 ENCOUNTER — Other Ambulatory Visit: Payer: Self-pay

## 2022-07-28 ENCOUNTER — Other Ambulatory Visit: Payer: Self-pay

## 2022-08-13 ENCOUNTER — Ambulatory Visit (HOSPITAL_COMMUNITY)
Admission: RE | Admit: 2022-08-13 | Discharge: 2022-08-13 | Disposition: A | Discharge status: Home / Self Care | Payer: 59 | Source: Ambulatory Visit | Attending: Vascular Surgery | Admitting: Vascular Surgery

## 2022-08-13 DIAGNOSIS — I701 Atherosclerosis of renal artery: Secondary | ICD-10-CM | POA: Diagnosis not present

## 2022-08-14 ENCOUNTER — Other Ambulatory Visit (HOSPITAL_COMMUNITY): Payer: Self-pay

## 2022-08-18 ENCOUNTER — Other Ambulatory Visit (HOSPITAL_COMMUNITY): Payer: Self-pay

## 2022-08-18 ENCOUNTER — Other Ambulatory Visit: Payer: Self-pay

## 2022-08-18 MED ORDER — LABETALOL HCL 200 MG PO TABS
200.0000 mg | ORAL_TABLET | Freq: Two times a day (BID) | ORAL | 3 refills | Status: DC
  Filled 2022-08-18 (×2): qty 60, 30d supply, fill #0
  Filled 2022-09-15: qty 60, 30d supply, fill #1
  Filled 2022-10-12: qty 60, 30d supply, fill #2
  Filled 2022-11-12: qty 60, 30d supply, fill #3

## 2022-08-19 ENCOUNTER — Other Ambulatory Visit (HOSPITAL_COMMUNITY): Payer: Self-pay

## 2022-08-24 ENCOUNTER — Other Ambulatory Visit (HOSPITAL_COMMUNITY): Payer: Self-pay

## 2022-08-26 ENCOUNTER — Other Ambulatory Visit (HOSPITAL_COMMUNITY): Payer: Self-pay

## 2022-08-26 ENCOUNTER — Ambulatory Visit: Payer: 59 | Admitting: Emergency Medicine

## 2022-08-26 ENCOUNTER — Other Ambulatory Visit: Payer: Self-pay

## 2022-08-26 ENCOUNTER — Encounter: Payer: Self-pay | Admitting: Emergency Medicine

## 2022-08-26 VITALS — BP 130/70 | HR 65 | Temp 97.8°F | Ht 64.0 in | Wt 118.2 lb

## 2022-08-26 DIAGNOSIS — F32A Depression, unspecified: Secondary | ICD-10-CM

## 2022-08-26 DIAGNOSIS — I701 Atherosclerosis of renal artery: Secondary | ICD-10-CM | POA: Diagnosis not present

## 2022-08-26 DIAGNOSIS — E785 Hyperlipidemia, unspecified: Secondary | ICD-10-CM | POA: Diagnosis not present

## 2022-08-26 DIAGNOSIS — N1832 Chronic kidney disease, stage 3b: Secondary | ICD-10-CM

## 2022-08-26 DIAGNOSIS — I10 Essential (primary) hypertension: Secondary | ICD-10-CM

## 2022-08-26 DIAGNOSIS — F419 Anxiety disorder, unspecified: Secondary | ICD-10-CM

## 2022-08-26 NOTE — Patient Instructions (Signed)
Hypertension, Adult High blood pressure (hypertension) is when the force of blood pumping through the arteries is too strong. The arteries are the blood vessels that carry blood from the heart throughout the body. Hypertension forces the heart to work harder to pump blood and may cause arteries to become narrow or stiff. Untreated or uncontrolled hypertension can lead to a heart attack, heart failure, a stroke, kidney disease, and other problems. A blood pressure reading consists of a higher number over a lower number. Ideally, your blood pressure should be below 120/80. The first ("top") number is called the systolic pressure. It is a measure of the pressure in your arteries as your heart beats. The second ("bottom") number is called the diastolic pressure. It is a measure of the pressure in your arteries as the heart relaxes. What are the causes? The exact cause of this condition is not known. There are some conditions that result in high blood pressure. What increases the risk? Certain factors may make you more likely to develop high blood pressure. Some of these risk factors are under your control, including: Smoking. Not getting enough exercise or physical activity. Being overweight. Having too much fat, sugar, calories, or salt (sodium) in your diet. Drinking too much alcohol. Other risk factors include: Having a personal history of heart disease, diabetes, high cholesterol, or kidney disease. Stress. Having a family history of high blood pressure and high cholesterol. Having obstructive sleep apnea. Age. The risk increases with age. What are the signs or symptoms? High blood pressure may not cause symptoms. Very high blood pressure (hypertensive crisis) may cause: Headache. Fast or irregular heartbeats (palpitations). Shortness of breath. Nosebleed. Nausea and vomiting. Vision changes. Severe chest pain, dizziness, and seizures. How is this diagnosed? This condition is diagnosed by  measuring your blood pressure while you are seated, with your arm resting on a flat surface, your legs uncrossed, and your feet flat on the floor. The cuff of the blood pressure monitor will be placed directly against the skin of your upper arm at the level of your heart. Blood pressure should be measured at least twice using the same arm. Certain conditions can cause a difference in blood pressure between your right and left arms. If you have a high blood pressure reading during one visit or you have normal blood pressure with other risk factors, you may be asked to: Return on a different day to have your blood pressure checked again. Monitor your blood pressure at home for 1 week or longer. If you are diagnosed with hypertension, you may have other blood or imaging tests to help your health care provider understand your overall risk for other conditions. How is this treated? This condition is treated by making healthy lifestyle changes, such as eating healthy foods, exercising more, and reducing your alcohol intake. You may be referred for counseling on a healthy diet and physical activity. Your health care provider may prescribe medicine if lifestyle changes are not enough to get your blood pressure under control and if: Your systolic blood pressure is above 130. Your diastolic blood pressure is above 80. Your personal target blood pressure may vary depending on your medical conditions, your age, and other factors. Follow these instructions at home: Eating and drinking  Eat a diet that is high in fiber and potassium, and low in sodium, added sugar, and fat. An example of this eating plan is called the DASH diet. DASH stands for Dietary Approaches to Stop Hypertension. To eat this way: Eat   plenty of fresh fruits and vegetables. Try to fill one half of your plate at each meal with fruits and vegetables. Eat whole grains, such as whole-wheat pasta, brown rice, or whole-grain bread. Fill about one  fourth of your plate with whole grains. Eat or drink low-fat dairy products, such as skim milk or low-fat yogurt. Avoid fatty cuts of meat, processed or cured meats, and poultry with skin. Fill about one fourth of your plate with lean proteins, such as fish, chicken without skin, beans, eggs, or tofu. Avoid pre-made and processed foods. These tend to be higher in sodium, added sugar, and fat. Reduce your daily sodium intake. Many people with hypertension should eat less than 1,500 mg of sodium a day. Do not drink alcohol if: Your health care provider tells you not to drink. You are pregnant, may be pregnant, or are planning to become pregnant. If you drink alcohol: Limit how much you have to: 0-1 drink a day for women. 0-2 drinks a day for men. Know how much alcohol is in your drink. In the [place] one drink equals one 12 oz bottle of beer (355 mL), one 5 oz glass of wine (148 mL), or one 1 oz glass of hard liquor (44 mL). Lifestyle  Work with your health care provider to maintain a healthy body weight or to lose weight. Ask what an ideal weight is for you. Get at least 30 minutes of exercise that causes your heart to beat faster (aerobic exercise) most days of the week. Activities may include walking, swimming, or biking. Include exercise to strengthen your muscles (resistance exercise), such as Pilates or lifting weights, as part of your weekly exercise routine. Try to do these types of exercises for 30 minutes at least 3 days a week. Do not use any products that contain nicotine or tobacco. These products include cigarettes, chewing tobacco, and vaping devices, such as e-cigarettes. If you need help quitting, ask your health care provider. Monitor your blood pressure at home as told by your health care provider. Keep all follow-up visits. This is important. Medicines Take over-the-counter and prescription medicines only as told by your health care provider. Follow directions carefully. Blood  pressure medicines must be taken as prescribed. Do not skip doses of blood pressure medicine. Doing this puts you at risk for problems and can make the medicine less effective. Ask your health care provider about side effects or reactions to medicines that you should watch for. Contact a health care provider if you: Think you are having a reaction to a medicine you are taking. Have headaches that keep coming back (recurring). Feel dizzy. Have swelling in your ankles. Have trouble with your vision. Get help right away if you: Develop a severe headache or confusion. Have unusual weakness or numbness. Feel faint. Have severe pain in your chest or abdomen. Vomit repeatedly. Have trouble breathing. These symptoms may be an emergency. Get help right away. Call 911. Do not wait to see if the symptoms will go away. Do not drive yourself to the hospital. Summary Hypertension is when the force of blood pumping through your arteries is too strong. If this condition is not controlled, it may put you at risk for serious complications. Your personal target blood pressure may vary depending on your medical conditions, your age, and other factors. For most people, a normal blood pressure is less than 120/80. Hypertension is treated with lifestyle changes, medicines, or a combination of both. Lifestyle changes include losing weight, eating a healthy,   low-sodium diet, exercising more, and limiting alcohol. This information is not intended to replace advice given to you by your health care provider. Make sure you discuss any questions you have with your health care provider. Document Revised: 04/29/2021 Document Reviewed: 04/29/2021 Elsevier Patient Education  2023 [employer]  

## 2022-08-26 NOTE — Assessment & Plan Note (Addendum)
Elevated blood pressure reading in the office but normal readings at home.  No evidence of renal artery stenosis on most recent renal ultrasound.  Report reviewed by me with patient. Continue valsartan 320 mg daily and labetalol 200 mg twice a day Cardiovascular risk associated with hypertension discussed. Diet approaches to stop hypertension discussed.

## 2022-08-26 NOTE — Assessment & Plan Note (Signed)
No evidence of renal artery stenosis on most recent ultrasound done earlier this month.

## 2022-08-26 NOTE — Assessment & Plan Note (Signed)
Stable.  Stress management discussed.

## 2022-08-26 NOTE — Assessment & Plan Note (Signed)
Advised to stay well-hydrated and avoid NSAIDs. ?

## 2022-08-26 NOTE — Progress Notes (Signed)
Robin Henderson [id number] 57 y.o.   Chief Complaint  Patient presents with   Follow-up    46mth f/u appt, no concerns     HISTORY OF PRESENT ILLNESS: This is a 57y.o. female here for follow-up of hypertension. Normal blood pressure readings at home. Recent ultrasound done on 08/13/2022 read as follows: Normal-sized right kidney.  No evidence of right renal artery stenosis Normal-sized of left kidney.  No evidence of left renal artery stenosis Read by BServando Snare MD Overall doing well.  Has no complaints or medical concerns today. BP Readings from Last 3 Encounters:  08/26/22 (!) 138/90  04/02/22 130/70  12/31/21 132/74   Wt Readings from Last 3 Encounters:  08/26/22 118 lb 4 oz (53.6 kg)  04/02/22 117 lb (53.1 kg)  12/31/21 118 lb 8 oz (53.8 kg)     HPI   Prior to Admission medications   Medication Sig Start Date End Date Taking? Authorizing Provider  labetalol (NORMODYNE) 200 MG tablet Take 200 mg by mouth 2 (two) times daily.   Yes [provider]  labetalol (NORMODYNE) 200 MG tablet Take 1 tablet by mouth 2 times a day 12/10/21  Yes   labetalol (NORMODYNE) 200 MG tablet Take 1 tablet (200 mg total) by mouth 2 (two) times daily. 08/18/22  Yes   rosuvastatin (CRESTOR) 10 MG tablet Take 1 tablet (10 mg total) by mouth daily. 06/28/22  Yes Cotton Beckley, MInes Bloomer MD  valsartan (DIOVAN) 320 MG tablet Take 1 tablet by mouth daily. 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  Almost slit wrists and almost jumped off a parking deck    Past Surgical History:  Procedure Laterality Date   COLONOSCOPY  02/2021   EXTERNAL EAR SURGERY  1995   correct earring hole   HEAD & NECK SKIN LESION EXCISIONAL BIOPSY  1995   lip lesion   WISDOM TOOTH EXTRACTION      Social History   Socioeconomic History   Marital status: Single    Spouse name: Not on file   Number of children: Not on file   Years of education: Not on file   Highest education level: Not on file  Occupational History   Not on file  Tobacco Use   Smoking status: Never   Smokeless tobacco: Never  Vaping Use   Vaping Use: Never used  Substance and Sexual Activity   Alcohol use: Yes    Alcohol/week: 1.0 standard drink of alcohol    Types: 1 Standard  drinks or equivalent per week   Drug use: No   Sexual activity: Yes    Comment: N/A insurance questions  Other Topics Concern   Not on file  Social History Narrative   Works.  Moving to new apartment to be nearer her boyfriend.     Social Determinants of Health   Financial Resource Strain: Not on file  Food Insecurity: Not on file  Transportation Needs: Not on file  Physical Activity: Not on file  Stress: Not on file  Social Connections: Not on file  Intimate Partner Violence: Not on file    Family History  Problem Relation Age of Onset   Heart disease Mother    Alcohol abuse Mother    Epilepsy Sister    Depression Sister        possible suicide attempt   Epilepsy Brother    Depression Brother        with  suicide attempts   Alcohol abuse Maternal Grandfather    Colon polyps Neg Hx    Colon cancer Neg Hx    Esophageal cancer Neg Hx    Rectal cancer Neg Hx    Stomach cancer Neg Hx      Review of Systems  Constitutional: Negative.  Negative for chills and fever.  HENT: Negative.  Negative for congestion and sore throat.   Respiratory: Negative.  Negative for cough and shortness of breath.   Cardiovascular:  Negative for chest pain and palpitations.  Gastrointestinal: Negative.  Negative for abdominal pain, diarrhea, nausea and vomiting.  Genitourinary: Negative.  Negative for dysuria and hematuria.  Musculoskeletal: Negative.   Skin: Negative.  Negative for rash.  Neurological: Negative.  Negative for dizziness and headaches.  All other systems reviewed and are negative.  Today's Vitals   08/26/22 1011 08/26/22 1054  BP: (!) 138/90 136/88  Pulse: 65   Temp: 97.8 F (36.6 C)   TempSrc: Oral   SpO2: 93%   Weight: 118 lb 4 oz (53.6 kg)   Height: 5' 4"$  (1.626 m)    Body mass index is 20.3 kg/m.   Physical Exam Vitals reviewed.  Constitutional:      Appearance: Normal appearance.  HENT:     Head: Normocephalic.  Eyes:     Extraocular Movements: Extraocular movements intact.  Cardiovascular:     Rate and Rhythm: Normal rate and regular rhythm.     Pulses: Normal pulses.     Heart sounds: Normal heart sounds.  Pulmonary:     Effort: Pulmonary effort is normal.     Breath sounds: Normal breath sounds.  Skin:    General: Skin is warm and dry.  Neurological:     General: No focal deficit present.     Mental Status: She is alert and oriented to person, place, and time.  Psychiatric:        Mood and Affect: Mood normal.        Behavior: Behavior normal.      ASSESSMENT & PLAN: A total of 47 minutes was spent with the patient and counseling/coordination of care regarding preparing for this visit, review of most recent office visit notes, review of most recent renal  ultrasound report, diagnosis of hypertension and cardiovascular risks associated with this condition, review of all medications, education on nutrition, review of health maintenance items, stress management, prognosis, documentation and need for follow-up.  Problem List Items Addressed This Visit       Cardiovascular and Mediastinum   Essential hypertension - Primary  Elevated blood pressure reading in the office but normal readings at home.  No evidence of renal artery stenosis on most recent renal ultrasound.  Report reviewed by me with patient. Continue valsartan 320 mg daily and labetalol 200 mg twice a day Cardiovascular risk associated with hypertension discussed. Diet approaches to stop hypertension discussed.      Renal artery stenosis (HCC)    No evidence of renal artery stenosis on most recent ultrasound done earlier this month.        Genitourinary   Stage 3b chronic kidney disease ([place])    Advised to stay well-hydrated and avoid NSAIDs.        Other   Anxiety and depression    Stable.  Stress management discussed.      Dyslipidemia    Chronic stable condition.  Continue rosuvastatin 10 mg daily. Diet and nutrition discussed.      Patient Instructions  Hypertension, Adult High blood pressure (hypertension) is when the force of blood pumping through the arteries is too strong. The arteries are the blood vessels that carry blood from the heart throughout the body. Hypertension forces the heart to work harder to pump blood and may cause arteries to become narrow or stiff. Untreated or uncontrolled hypertension can lead to a heart attack, heart failure, a stroke, kidney disease, and other problems. A blood pressure reading consists of a higher number over a lower number. Ideally, your blood pressure should be below 120/80. The first ("top") number is called the systolic pressure. It is a measure of the pressure in your arteries as your heart beats. The second ("bottom")  number is called the diastolic pressure. It is a measure of the pressure in your arteries as the heart relaxes. What are the causes? The exact cause of this condition is not known. There are some conditions that result in high blood pressure. What increases the risk? Certain factors may make you more likely to develop high blood pressure. Some of these risk factors are under your control, including: Smoking. Not getting enough exercise or physical activity. Being overweight. Having too much fat, sugar, calories, or salt (sodium) in your diet. Drinking too much alcohol. Other risk factors include: Having a personal history of heart disease, diabetes, high cholesterol, or kidney disease. Stress. Having a family history of high blood pressure and high cholesterol. Having obstructive sleep apnea. Age. The risk increases with age. What are the signs or symptoms? High blood pressure may not cause symptoms. Very high blood pressure (hypertensive crisis) may cause: Headache. Fast or irregular heartbeats (palpitations). Shortness of breath. Nosebleed. Nausea and vomiting. Vision changes. Severe chest pain, dizziness, and seizures. How is this diagnosed? This condition is diagnosed by measuring your blood pressure while you are seated, with your arm resting on a flat surface, your legs uncrossed, and your feet flat on the floor. The cuff of the blood pressure monitor will be placed directly against the skin of your upper arm at the level of your heart. Blood pressure should be measured at least twice using the same arm. Certain conditions can cause a difference in blood pressure between your right and left arms. If you have a high blood pressure reading during one visit or you have normal blood pressure with other risk factors, you may be asked to: Return on a different day to have your blood pressure checked again. Monitor your blood pressure at home for 1 week or longer. If you are diagnosed  with hypertension, you may have other  blood or imaging tests to help your health care provider understand your overall risk for other conditions. How is this treated? This condition is treated by making healthy lifestyle changes, such as eating healthy foods, exercising more, and reducing your alcohol intake. You may be referred for counseling on a healthy diet and physical activity. Your health care provider may prescribe medicine if lifestyle changes are not enough to get your blood pressure under control and if: Your systolic blood pressure is above 130. Your diastolic blood pressure is above 80. Your personal target blood pressure may vary depending on your medical conditions, your age, and other factors. Follow these instructions at home: Eating and drinking  Eat a diet that is high in fiber and potassium, and low in sodium, added sugar, and fat. An example of this eating plan is called the DASH diet. DASH stands for Dietary Approaches to Stop Hypertension. To eat this way: Eat plenty of fresh fruits and vegetables. Try to fill one half of your plate at each meal with fruits and vegetables. Eat whole grains, such as whole-wheat pasta, brown rice, or whole-grain bread. Fill about one fourth of your plate with whole grains. Eat or drink low-fat dairy products, such as skim milk or low-fat yogurt. Avoid fatty cuts of meat, processed or cured meats, and poultry with skin. Fill about one fourth of your plate with lean proteins, such as fish, chicken without skin, beans, eggs, or tofu. Avoid pre-made and processed foods. These tend to be higher in sodium, added sugar, and fat. Reduce your daily sodium intake. Many people with hypertension should eat less than 1,500 mg of sodium a day. Do not drink alcohol if: Your health care provider tells you not to drink. You are pregnant, may be pregnant, or are planning to become pregnant. If you drink alcohol: Limit how much you have to: 0-1 drink a day  for women. 0-2 drinks a day for men. Know how much alcohol is in your drink. In the [place] one drink equals one 12 oz bottle of beer (355 mL), one 5 oz glass of wine (148 mL), or one 1 oz glass of hard liquor (44 mL). Lifestyle  Work with your health care provider to maintain a healthy body weight or to lose weight. Ask what an ideal weight is for you. Get at least 30 minutes of exercise that causes your heart to beat faster (aerobic exercise) most days of the week. Activities may include walking, swimming, or biking. Include exercise to strengthen your muscles (resistance exercise), such as Pilates or lifting weights, as part of your weekly exercise routine. Try to do these types of exercises for 30 minutes at least 3 days a week. Do not use any products that contain nicotine or tobacco. These products include cigarettes, chewing tobacco, and vaping devices, such as e-cigarettes. If you need help quitting, ask your health care provider. Monitor your blood pressure at home as told by your health care provider. Keep all follow-up visits. This is important. Medicines Take over-the-counter and prescription medicines only as told by your health care provider. Follow directions carefully. Blood pressure medicines must be taken as prescribed. Do not skip doses of blood pressure medicine. Doing this puts you at risk for problems and can make the medicine less effective. Ask your health care provider about side effects or reactions to medicines that you should watch for. Contact a health care provider if you: Think you are having a reaction to a medicine you are taking. Have  headaches that keep coming back (recurring). Feel dizzy. Have swelling in your ankles. Have trouble with your vision. Get help right away if you: Develop a severe headache or confusion. Have unusual weakness or numbness. Feel faint. Have severe pain in your chest or abdomen. Vomit repeatedly. Have trouble breathing. These  symptoms may be an emergency. Get help right away. Call 911. Do not wait to see if the symptoms will go away. Do not drive yourself to the hospital. Summary Hypertension is when the force of blood pumping through your arteries is too strong. If this condition is not controlled, it may put you at risk for serious complications. Your personal target blood pressure may vary depending on your medical conditions, your age, and other factors. For most people, a normal blood pressure is less than 120/80. Hypertension is treated with lifestyle changes, medicines, or a combination of both. Lifestyle changes include losing weight, eating a healthy, low-sodium diet, exercising more, and limiting alcohol. This information is not intended to replace advice given to you by your health care provider. Make sure you discuss any questions you have with your health care provider. Document Revised: 04/29/2021 Document Reviewed: 04/29/2021 Elsevier Patient Education  [place], MD [facility]

## 2022-08-26 NOTE — Assessment & Plan Note (Signed)
Chronic stable condition.  Continue rosuvastatin 10 mg daily. Diet and nutrition discussed.

## 2022-09-01 ENCOUNTER — Encounter: Payer: Self-pay | Admitting: Vascular Surgery

## 2022-09-01 ENCOUNTER — Ambulatory Visit: Payer: 59 | Admitting: Vascular Surgery

## 2022-09-01 VITALS — BP 167/87 | HR 67 | Temp 97.6°F | Resp 16 | Ht 64.0 in | Wt 118.0 lb

## 2022-09-01 DIAGNOSIS — I701 Atherosclerosis of renal artery: Secondary | ICD-10-CM | POA: Diagnosis not present

## 2022-09-01 NOTE — Progress Notes (Signed)
Patient name: Robin Henderson MRN: [id number] DOB: [birth year] Sex: female  REASON FOR CONSULT: 6 month follow-up for renal artery stenosis surveillance  HPI: Robin Henderson is a 57 y.o. female, with history of hypertension, stage III CKD, and epilepsy that presents for 6 month follow-up of her renal artery stenosis.  Patient was referred by Dr. Hollie Salk with nephrology.  She states she was in her normal state of health until about August of 2022 when she noticed that she had increasing blood pressure problems.  She had been started on labetalol and valsartan.  She previously had evidence of a mild right renal artery stenosis that we followed and did not recommend intervention.  Her peak systolic velocity was [id number].    Today states her blood pressure has been well-controlled on the valsartan and labetalol.  She is followed by Dr. Mitchel Honour her PCP.  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  Almost slit wrists and almost jumped off a parking deck    Past Surgical History:  Procedure Laterality Date   COLONOSCOPY  02/2021   EXTERNAL EAR SURGERY  1995   correct earring hole   HEAD & NECK SKIN LESION EXCISIONAL BIOPSY  1995   lip lesion   WISDOM TOOTH EXTRACTION      Family History  Problem Relation Age of Onset   Heart disease Mother    Alcohol abuse Mother    Epilepsy Sister    Depression Sister        possible suicide attempt   Epilepsy Brother    Depression Brother        with suicide attempts   Alcohol abuse Maternal Grandfather    Colon polyps Neg Hx    Colon cancer Neg Hx    Esophageal cancer Neg Hx    Rectal cancer Neg Hx    Stomach cancer Neg Hx     SOCIAL HISTORY: Social History   Socioeconomic History   Marital status: Single     Spouse name: Not on file   Number of children: Not on file   Years of education: Not on file   Highest education level: Not on file  Occupational History   Not on file  Tobacco Use   Smoking status: Never   Smokeless tobacco: Never  Vaping Use   Vaping Use: Never used  Substance and Sexual Activity   Alcohol use: Yes    Alcohol/week: 1.0 standard drink of alcohol    Types: 1 Standard drinks or equivalent per week   Drug use: No   Sexual activity: Yes    Comment: N/A insurance questions  Other Topics Concern   Not on file  Social History Narrative   Works.  Moving to new apartment to be nearer her boyfriend.     Social Determinants of Health   Financial Resource Strain: Not on file  Food Insecurity: Not on file  Transportation Needs: Not on file  Physical Activity: Not on file  Stress: Not on file  Social Connections: Not on file  Intimate Partner Violence: Not on file    No Known Allergies  Current Outpatient Medications  Medication Sig Dispense Refill   labetalol (NORMODYNE) 200 MG tablet Take 200 mg by  mouth 2 (two) times daily.     labetalol (NORMODYNE) 200 MG tablet Take 1 tablet by mouth 2 times a day 60 tablet 3   labetalol (NORMODYNE) 200 MG tablet Take 1 tablet (200 mg total) by mouth 2 (two) times daily. 60 tablet 3   rosuvastatin (CRESTOR) 10 MG tablet Take 1 tablet (10 mg total) by mouth daily. 90 tablet 1   valsartan (DIOVAN) 320 MG tablet Take 1 tablet by mouth daily. 90 tablet 3   No current facility-administered medications for this visit.    REVIEW OF SYSTEMS:  '[X]'$  denotes positive finding, '[ ]'$  denotes negative finding Cardiac  Comments:  Chest pain or chest pressure:    Shortness of breath upon exertion:    Short of breath when lying flat:    Irregular heart rhythm:        Vascular    Pain in calf, thigh, or hip brought on by ambulation:    Pain in feet at night that wakes you up from your sleep:     Blood clot in your veins:    Leg  swelling:         Pulmonary    Oxygen at home:    Productive cough:     Wheezing:         Neurologic    Sudden weakness in arms or legs:     Sudden numbness in arms or legs:     Sudden onset of difficulty speaking or slurred speech:    Temporary loss of vision in one eye:     Problems with dizziness:         Gastrointestinal    Blood in stool:     Vomited blood:         Genitourinary    Burning when urinating:     Blood in urine:        Psychiatric    Major depression:         Hematologic    Bleeding problems:    Problems with blood clotting too easily:        Skin    Rashes or ulcers:        Constitutional    Fever or chills:      PHYSICAL EXAM: Vitals:   09/01/22 1520  BP: (!) 167/87  Pulse: 67  Resp: 16  Temp: 97.6 F (36.4 C)  TempSrc: Temporal  SpO2: 98%  Weight: 118 lb (53.5 kg)  Height: '5\' 4"'$  (1.626 m)    GENERAL: The patient is a well-nourished female, in no acute distress. The vital signs are documented above. CARDIAC: There is a regular rate and rhythm.  VASCULAR:  Palpable femoral pulses bilaterally Palpable PT pulses bilaterally PULMONARY: No respiratory distress. ABDOMEN: Soft and non-tender. MUSCULOSKELETAL: There are no major deformities or cyanosis. NEUROLOGIC: No focal weakness or paresthesias are detected. SKIN: There are no ulcers or rashes noted. PSYCHIATRIC: The patient has a normal affect.  DATA:   Repeat renal artery duplex here on 08/13/22 with no renal artery stenosis identified and both kidneys are normal size.  Assessment/Plan:  57 year old female presents for 6 month follow-up of her suspected renal artery stenosis in the setting of hypertension and stage III CKD.  Previous renal ultrasound not done in our practice showed evidence of a mild right renal artery stenosis.  We previously did not recommend any intervention given there was no critical stenosis greater than 60% as we previously discussed.  Her repeat renal artery  duplex here on  08/13/2022 shows no renal artery stenosis identified.  Discussed again she does not need any intervention from my standpoint.  Given the discrepancy, I will see her again in 2 years with a renal artery duplex.  If normal again in 2 years probably can stop future surveillance.  Marty Heck, MD [facility] Office: [phone]

## 2022-09-16 ENCOUNTER — Other Ambulatory Visit: Payer: Self-pay

## 2022-09-16 ENCOUNTER — Other Ambulatory Visit (HOSPITAL_COMMUNITY): Payer: Self-pay

## 2022-09-26 ENCOUNTER — Other Ambulatory Visit: Payer: Self-pay

## 2022-09-28 ENCOUNTER — Other Ambulatory Visit: Payer: Self-pay

## 2022-10-13 ENCOUNTER — Other Ambulatory Visit: Payer: Self-pay

## 2022-10-26 ENCOUNTER — Other Ambulatory Visit (HOSPITAL_COMMUNITY): Payer: Self-pay

## 2022-10-27 ENCOUNTER — Other Ambulatory Visit: Payer: Self-pay

## 2022-11-12 ENCOUNTER — Other Ambulatory Visit (HOSPITAL_COMMUNITY): Payer: Self-pay

## 2022-11-13 ENCOUNTER — Other Ambulatory Visit: Payer: Self-pay

## 2022-11-21 ENCOUNTER — Other Ambulatory Visit: Payer: Self-pay | Admitting: Emergency Medicine

## 2022-11-22 MED ORDER — VALSARTAN 320 MG PO TABS
320.0000 mg | ORAL_TABLET | Freq: Every day | ORAL | 3 refills | Status: DC
  Filled 2022-11-22: qty 30, 30d supply, fill #0
  Filled 2022-12-21: qty 30, 30d supply, fill #1
  Filled 2023-01-22: qty 30, 30d supply, fill #2
  Filled 2023-02-20: qty 30, 30d supply, fill #3
  Filled 2023-03-21: qty 30, 30d supply, fill #4
  Filled 2023-04-21: qty 30, 30d supply, fill #5
  Filled 2023-05-24: qty 30, 30d supply, fill #6
  Filled 2023-06-23: qty 30, 30d supply, fill #7
  Filled 2023-07-25: qty 30, 30d supply, fill #8
  Filled 2023-08-23: qty 30, 30d supply, fill #9
  Filled 2023-09-23: qty 30, 30d supply, fill #10
  Filled 2023-10-25: qty 30, 30d supply, fill #11

## 2022-11-23 ENCOUNTER — Other Ambulatory Visit (HOSPITAL_COMMUNITY): Payer: Self-pay

## 2022-11-23 ENCOUNTER — Other Ambulatory Visit: Payer: Self-pay

## 2022-12-21 ENCOUNTER — Other Ambulatory Visit: Payer: Self-pay | Admitting: Emergency Medicine

## 2022-12-21 ENCOUNTER — Other Ambulatory Visit (HOSPITAL_COMMUNITY): Payer: Self-pay

## 2022-12-21 DIAGNOSIS — E785 Hyperlipidemia, unspecified: Secondary | ICD-10-CM

## 2022-12-21 MED ORDER — ROSUVASTATIN CALCIUM 10 MG PO TABS
10.0000 mg | ORAL_TABLET | Freq: Every day | ORAL | 1 refills | Status: DC
  Filled 2022-12-21: qty 30, 30d supply, fill #0
  Filled 2023-01-22: qty 30, 30d supply, fill #1
  Filled 2023-02-20: qty 30, 30d supply, fill #2
  Filled 2023-03-21: qty 30, 30d supply, fill #3
  Filled 2023-04-21: qty 30, 30d supply, fill #4
  Filled 2023-05-24: qty 30, 30d supply, fill #5

## 2022-12-22 ENCOUNTER — Other Ambulatory Visit (HOSPITAL_COMMUNITY): Payer: Self-pay

## 2022-12-22 ENCOUNTER — Other Ambulatory Visit: Payer: Self-pay

## 2022-12-22 MED ORDER — LABETALOL HCL 200 MG PO TABS
200.0000 mg | ORAL_TABLET | Freq: Two times a day (BID) | ORAL | 3 refills | Status: DC
  Filled 2022-12-22: qty 60, 30d supply, fill #0
  Filled 2023-02-20: qty 60, 30d supply, fill #1
  Filled 2023-04-21: qty 60, 30d supply, fill #2
  Filled 2023-06-23: qty 60, 30d supply, fill #3

## 2023-01-23 ENCOUNTER — Other Ambulatory Visit (HOSPITAL_COMMUNITY): Payer: Self-pay

## 2023-01-25 ENCOUNTER — Other Ambulatory Visit: Payer: Self-pay

## 2023-02-03 ENCOUNTER — Encounter (INDEPENDENT_AMBULATORY_CARE_PROVIDER_SITE_OTHER): Payer: Self-pay

## 2023-02-09 ENCOUNTER — Other Ambulatory Visit: Payer: Self-pay | Admitting: Emergency Medicine

## 2023-02-09 DIAGNOSIS — Z1231 Encounter for screening mammogram for malignant neoplasm of breast: Secondary | ICD-10-CM

## 2023-02-20 ENCOUNTER — Other Ambulatory Visit (HOSPITAL_COMMUNITY): Payer: Self-pay

## 2023-02-22 ENCOUNTER — Other Ambulatory Visit: Payer: Self-pay

## 2023-02-24 ENCOUNTER — Ambulatory Visit: Payer: 59 | Admitting: Emergency Medicine

## 2023-02-24 ENCOUNTER — Encounter: Payer: Self-pay | Admitting: Emergency Medicine

## 2023-02-24 VITALS — BP 130/80 | HR 69 | Temp 97.6°F | Ht 64.0 in | Wt 117.4 lb

## 2023-02-24 DIAGNOSIS — I10 Essential (primary) hypertension: Secondary | ICD-10-CM

## 2023-02-24 DIAGNOSIS — E785 Hyperlipidemia, unspecified: Secondary | ICD-10-CM | POA: Diagnosis not present

## 2023-02-24 DIAGNOSIS — N1832 Chronic kidney disease, stage 3b: Secondary | ICD-10-CM

## 2023-02-24 NOTE — Assessment & Plan Note (Signed)
Stable chronic condition Diet and nutrition discussed Continue rosuvastatin 10 mg daily The 10-year ASCVD risk score (Arnett DK, et al., 2019) is: 2.4%   Values used to calculate the score:     Age: 56 years     Sex: Female     Is Non-Hispanic African American: No     Diabetic: No     Tobacco smoker: No     Systolic Blood Pressure: 130 mmHg     Is BP treated: Yes     HDL Cholesterol: 98.5 mg/dL     Total Cholesterol: 295 mg/dL

## 2023-02-24 NOTE — Patient Instructions (Signed)
Hypertension, Adult High blood pressure (hypertension) is when the force of blood pumping through the arteries is too strong. The arteries are the blood vessels that carry blood from the heart throughout the body. Hypertension forces the heart to work harder to pump blood and may cause arteries to become narrow or stiff. Untreated or uncontrolled hypertension can lead to a heart attack, heart failure, a stroke, kidney disease, and other problems. A blood pressure reading consists of a higher number over a lower number. Ideally, your blood pressure should be below 120/80. The first ("top") number is called the systolic pressure. It is a measure of the pressure in your arteries as your heart beats. The second ("bottom") number is called the diastolic pressure. It is a measure of the pressure in your arteries as the heart relaxes. What are the causes? The exact cause of this condition is not known. There are some conditions that result in high blood pressure. What increases the risk? Certain factors may make you more likely to develop high blood pressure. Some of these risk factors are under your control, including: Smoking. Not getting enough exercise or physical activity. Being overweight. Having too much fat, sugar, calories, or salt (sodium) in your diet. Drinking too much alcohol. Other risk factors include: Having a personal history of heart disease, diabetes, high cholesterol, or kidney disease. Stress. Having a family history of high blood pressure and high cholesterol. Having obstructive sleep apnea. Age. The risk increases with age. What are the signs or symptoms? High blood pressure may not cause symptoms. Very high blood pressure (hypertensive crisis) may cause: Headache. Fast or irregular heartbeats (palpitations). Shortness of breath. Nosebleed. Nausea and vomiting. Vision changes. Severe chest pain, dizziness, and seizures. How is this diagnosed? This condition is diagnosed by  measuring your blood pressure while you are seated, with your arm resting on a flat surface, your legs uncrossed, and your feet flat on the floor. The cuff of the blood pressure monitor will be placed directly against the skin of your upper arm at the level of your heart. Blood pressure should be measured at least twice using the same arm. Certain conditions can cause a difference in blood pressure between your right and left arms. If you have a high blood pressure reading during one visit or you have normal blood pressure with other risk factors, you may be asked to: Return on a different day to have your blood pressure checked again. Monitor your blood pressure at home for 1 week or longer. If you are diagnosed with hypertension, you may have other blood or imaging tests to help your health care provider understand your overall risk for other conditions. How is this treated? This condition is treated by making healthy lifestyle changes, such as eating healthy foods, exercising more, and reducing your alcohol intake. You may be referred for counseling on a healthy diet and physical activity. Your health care provider may prescribe medicine if lifestyle changes are not enough to get your blood pressure under control and if: Your systolic blood pressure is above 130. Your diastolic blood pressure is above 80. Your personal target blood pressure may vary depending on your medical conditions, your age, and other factors. Follow these instructions at home: Eating and drinking  Eat a diet that is high in fiber and potassium, and low in sodium, added sugar, and fat. An example of this eating plan is called the DASH diet. DASH stands for Dietary Approaches to Stop Hypertension. To eat this way: Eat   plenty of fresh fruits and vegetables. Try to fill one half of your plate at each meal with fruits and vegetables. Eat whole grains, such as whole-wheat pasta, brown rice, or whole-grain bread. Fill about one  fourth of your plate with whole grains. Eat or drink low-fat dairy products, such as skim milk or low-fat yogurt. Avoid fatty cuts of meat, processed or cured meats, and poultry with skin. Fill about one fourth of your plate with lean proteins, such as fish, chicken without skin, beans, eggs, or tofu. Avoid pre-made and processed foods. These tend to be higher in sodium, added sugar, and fat. Reduce your daily sodium intake. Many people with hypertension should eat less than 1,500 mg of sodium a day. Do not drink alcohol if: Your health care provider tells you not to drink. You are pregnant, may be pregnant, or are planning to become pregnant. If you drink alcohol: Limit how much you have to: 0-1 drink a day for women. 0-2 drinks a day for men. Know how much alcohol is in your drink. In the [place] one drink equals one 12 oz bottle of beer (355 mL), one 5 oz glass of wine (148 mL), or one 1 oz glass of hard liquor (44 mL). Lifestyle  Work with your health care provider to maintain a healthy body weight or to lose weight. Ask what an ideal weight is for you. Get at least 30 minutes of exercise that causes your heart to beat faster (aerobic exercise) most days of the week. Activities may include walking, swimming, or biking. Include exercise to strengthen your muscles (resistance exercise), such as Pilates or lifting weights, as part of your weekly exercise routine. Try to do these types of exercises for 30 minutes at least 3 days a week. Do not use any products that contain nicotine or tobacco. These products include cigarettes, chewing tobacco, and vaping devices, such as e-cigarettes. If you need help quitting, ask your health care provider. Monitor your blood pressure at home as told by your health care provider. Keep all follow-up visits. This is important. Medicines Take over-the-counter and prescription medicines only as told by your health care provider. Follow directions carefully. Blood  pressure medicines must be taken as prescribed. Do not skip doses of blood pressure medicine. Doing this puts you at risk for problems and can make the medicine less effective. Ask your health care provider about side effects or reactions to medicines that you should watch for. Contact a health care provider if you: Think you are having a reaction to a medicine you are taking. Have headaches that keep coming back (recurring). Feel dizzy. Have swelling in your ankles. Have trouble with your vision. Get help right away if you: Develop a severe headache or confusion. Have unusual weakness or numbness. Feel faint. Have severe pain in your chest or abdomen. Vomit repeatedly. Have trouble breathing. These symptoms may be an emergency. Get help right away. Call 911. Do not wait to see if the symptoms will go away. Do not drive yourself to the hospital. Summary Hypertension is when the force of blood pumping through your arteries is too strong. If this condition is not controlled, it may put you at risk for serious complications. Your personal target blood pressure may vary depending on your medical conditions, your age, and other factors. For most people, a normal blood pressure is less than 120/80. Hypertension is treated with lifestyle changes, medicines, or a combination of both. Lifestyle changes include losing weight, eating a healthy,   low-sodium diet, exercising more, and limiting alcohol. This information is not intended to replace advice given to you by your health care provider. Make sure you discuss any questions you have with your health care provider. Document Revised: 04/29/2021 Document Reviewed: 04/29/2021 Elsevier Patient Education  2024 Else[employer] Inc.  

## 2023-02-24 NOTE — Progress Notes (Signed)
Robin Henderson 57 y.o.   Chief Complaint  Patient presents with   Hypertension    HISTORY OF PRESENT ILLNESS: This is a 57 y.o. female A1A here for follow-up of hypertension. Overall doing well.  Has no complaints or medical concerns today Had episodes of low blood pressure last May.  Since she reduced dose of labetalol to 200 mg once a day only.  Continues on valsartan. Normal blood pressure readings at home since the change. BP Readings from Last 3 Encounters:  02/24/23 (!) 140/92  09/01/22 (!) 167/87  08/26/22 130/70     Hypertension Pertinent negatives include no chest pain, headaches, palpitations or shortness of breath.     Prior to Admission medications   Medication Sig Start Date End Date Taking? Authorizing Provider  labetalol (NORMODYNE) 200 MG tablet Take 1 tablet (200 mg total) by mouth 2 (two) times daily. Patient taking differently: Take 200 mg by mouth once. 12/22/22  Yes   rosuvastatin (CRESTOR) 10 MG tablet Take 1 tablet (10 mg total) by mouth daily. 12/21/22  Yes Natlie Asfour, Eilleen Kempf, MD  valsartan (DIOVAN) 320 MG tablet Take 1 tablet (320 mg total) by mouth daily. 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  Almost slit wrists and almost jumped off a parking deck    Past Surgical History:  Procedure Laterality Date   COLONOSCOPY  02/2021   EXTERNAL EAR SURGERY  1995   correct earring hole   HEAD & NECK SKIN LESION EXCISIONAL BIOPSY  1995   lip lesion   WISDOM TOOTH EXTRACTION      Social History   Socioeconomic History   Marital status: Single    Spouse name: Not on file   Number of children: Not on file   Years of education: Not on file   Highest education level: Not on file  Occupational History   Not on file  Tobacco Use   Smoking status: Never   Smokeless tobacco: Never  Vaping Use   Vaping status: Never Used  Substance and Sexual Activity   Alcohol use: Yes    Alcohol/week: 1.0 standard drink of alcohol    Types: 1 Standard drinks or equivalent per week   Drug use: No   Sexual activity: Yes    Comment: N/A insurance questions  Other Topics Concern   Not on file  Social History Narrative   Works.  Moving to new apartment to be nearer her boyfriend.     Social Determinants of Health  Financial Resource Strain: Not on file  Food Insecurity: Not on file  Transportation Needs: Not on file  Physical Activity: Not on file  Stress: Not on file  Social Connections: Not on file  Intimate Partner Violence: Not on file    Family History  Problem Relation Age of Onset   Heart disease Mother    Alcohol abuse Mother    Epilepsy Sister    Depression Sister        possible suicide attempt   Epilepsy Brother    Depression Brother        with suicide attempts   Alcohol abuse Maternal Grandfather    Colon polyps Neg Hx    Colon cancer Neg Hx    Esophageal cancer Neg Hx    Rectal cancer Neg Hx    Stomach cancer Neg Hx      Review of Systems  Constitutional: Negative.  Negative for  chills and fever.  HENT: Negative.  Negative for congestion and sore throat.   Respiratory: Negative.  Negative for cough and shortness of breath.   Cardiovascular: Negative.  Negative for chest pain and palpitations.  Gastrointestinal: Negative.  Negative for abdominal pain, diarrhea, nausea and vomiting.  Genitourinary: Negative.  Negative for dysuria and hematuria.  Skin: Negative.  Negative for rash.  Neurological: Negative.  Negative for dizziness and headaches.  All other systems reviewed and are negative.   Vitals:   02/24/23 1003  BP: (!) 140/92  Pulse: 69  Temp: 97.6 F (36.4 C)  SpO2: 100%    Physical Exam Vitals reviewed.  Constitutional:      Appearance: Normal appearance.  HENT:     Head: Normocephalic.  Eyes:     Extraocular Movements: Extraocular movements intact.  Cardiovascular:     Rate and Rhythm: Normal rate.  Pulmonary:     Effort: Pulmonary effort is normal.  Skin:    General: Skin is warm and dry.  Neurological:     Mental Status: She is alert and oriented to person, place, and time.  Psychiatric:        Mood and Affect: Mood normal.        Behavior: Behavior normal.      ASSESSMENT & PLAN: A total of 44 minutes was spent with the patient and counseling/coordination of care regarding preparing for this visit, review of most recent office visit notes, review of chronic medical conditions under management, review of all medications and changes made, review of most recent blood work results, cardiovascular risks associated with hypertension, prognosis, documentation and need for follow-up.  Problem List Items Addressed This Visit       Cardiovascular and Mediastinum   Essential hypertension - Primary    Well-controlled hypertension after reducing the dose of labetalol due to hypotensive episodes at home. Continues valsartan 320 mg daily and labetalol 200 mg daily Cardiovascular risks associated with hypertension discussed Diet and nutrition  discussed        Genitourinary   Stage 3b chronic kidney disease (HCC)    Chronic stable condition Advised to stay well-hydrated and avoid NSAIDs        Other   Dyslipidemia    Stable chronic condition Diet and nutrition discussed Continue rosuvastatin 10 mg daily The 10-year ASCVD risk score (Arnett DK, et al., 2019) is: 2.4%   Values used to calculate the score:     Age: 45 years     Sex: Female     Is Non-Hispanic African American: No  Diabetic: No     Tobacco smoker: No     Systolic Blood Pressure: 130 mmHg     Is BP treated: Yes     HDL Cholesterol: 98.5 mg/dL     Total Cholesterol: 295 mg/dL       Patient Instructions  Hypertension, Adult High blood pressure (hypertension) is when the force of blood pumping through the arteries is too strong. The arteries are the blood vessels that carry blood from the heart throughout the body. Hypertension forces the heart to work harder to pump blood and may cause arteries to become narrow or stiff. Untreated or uncontrolled hypertension can lead to a heart attack, heart failure, a stroke, kidney disease, and other problems. A blood pressure reading consists of a higher number over a lower number. Ideally, your blood pressure should be below 120/80. The first ("top") number is called the systolic pressure. It is a measure of the pressure in your arteries as your heart beats. The second ("bottom") number is called the diastolic pressure. It is a measure of the pressure in your arteries as the heart relaxes. What are the causes? The exact cause of this condition is not known. There are some conditions that result in high blood pressure. What increases the risk? Certain factors may make you more likely to develop high blood pressure. Some of these risk factors are under your control, including: Smoking. Not getting enough exercise or physical activity. Being overweight. Having too much fat, sugar, calories, or salt (sodium) in your  diet. Drinking too much alcohol. Other risk factors include: Having a personal history of heart disease, diabetes, high cholesterol, or kidney disease. Stress. Having a family history of high blood pressure and high cholesterol. Having obstructive sleep apnea. Age. The risk increases with age. What are the signs or symptoms? High blood pressure may not cause symptoms. Very high blood pressure (hypertensive crisis) may cause: Headache. Fast or irregular heartbeats (palpitations). Shortness of breath. Nosebleed. Nausea and vomiting. Vision changes. Severe chest pain, dizziness, and seizures. How is this diagnosed? This condition is diagnosed by measuring your blood pressure while you are seated, with your arm resting on a flat surface, your legs uncrossed, and your feet flat on the floor. The cuff of the blood pressure monitor will be placed directly against the skin of your upper arm at the level of your heart. Blood pressure should be measured at least twice using the same arm. Certain conditions can cause a difference in blood pressure between your right and left arms. If you have a high blood pressure reading during one visit or you have normal blood pressure with other risk factors, you may be asked to: Return on a different day to have your blood pressure checked again. Monitor your blood pressure at home for 1 week or longer. If you are diagnosed with hypertension, you may have other blood or imaging tests to help your health care provider understand your overall risk for other conditions. How is this treated? This condition is treated by making healthy lifestyle changes, such as eating healthy foods, exercising more, and reducing your alcohol intake. You may be referred for counseling on a healthy diet and physical activity. Your health care provider may prescribe medicine if lifestyle changes are not enough to get your blood pressure under control and if: Your systolic blood pressure  is above 130. Your diastolic blood pressure is above 80. Your personal target blood pressure may vary depending on your medical conditions, your age, and other  factors. Follow these instructions at home: Eating and drinking  Eat a diet that is high in fiber and potassium, and low in sodium, added sugar, and fat. An example of this eating plan is called the DASH diet. DASH stands for Dietary Approaches to Stop Hypertension. To eat this way: Eat plenty of fresh fruits and vegetables. Try to fill one half of your plate at each meal with fruits and vegetables. Eat whole grains, such as whole-wheat pasta, brown rice, or whole-grain bread. Fill about one fourth of your plate with whole grains. Eat or drink low-fat dairy products, such as skim milk or low-fat yogurt. Avoid fatty cuts of meat, processed or cured meats, and poultry with skin. Fill about one fourth of your plate with lean proteins, such as fish, chicken without skin, beans, eggs, or tofu. Avoid pre-made and processed foods. These tend to be higher in sodium, added sugar, and fat. Reduce your daily sodium intake. Many people with hypertension should eat less than 1,500 mg of sodium a day. Do not drink alcohol if: Your health care provider tells you not to drink. You are pregnant, may be pregnant, or are planning to become pregnant. If you drink alcohol: Limit how much you have to: 0-1 drink a day for women. 0-2 drinks a day for men. Know how much alcohol is in your drink. In the [place] one drink equals one 12 oz bottle of beer (355 mL), one 5 oz glass of wine (148 mL), or one 1 oz glass of hard liquor (44 mL). Lifestyle  Work with your health care provider to maintain a healthy body weight or to lose weight. Ask what an ideal weight is for you. Get at least 30 minutes of exercise that causes your heart to beat faster (aerobic exercise) most days of the week. Activities may include walking, swimming, or biking. Include exercise to  strengthen your muscles (resistance exercise), such as Pilates or lifting weights, as part of your weekly exercise routine. Try to do these types of exercises for 30 minutes at least 3 days a week. Do not use any products that contain nicotine or tobacco. These products include cigarettes, chewing tobacco, and vaping devices, such as e-cigarettes. If you need help quitting, ask your health care provider. Monitor your blood pressure at home as told by your health care provider. Keep all follow-up visits. This is important. Medicines Take over-the-counter and prescription medicines only as told by your health care provider. Follow directions carefully. Blood pressure medicines must be taken as prescribed. Do not skip doses of blood pressure medicine. Doing this puts you at risk for problems and can make the medicine less effective. Ask your health care provider about side effects or reactions to medicines that you should watch for. Contact a health care provider if you: Think you are having a reaction to a medicine you are taking. Have headaches that keep coming back (recurring). Feel dizzy. Have swelling in your ankles. Have trouble with your vision. Get help right away if you: Develop a severe headache or confusion. Have unusual weakness or numbness. Feel faint. Have severe pain in your chest or abdomen. Vomit repeatedly. Have trouble breathing. These symptoms may be an emergency. Get help right away. Call 911. Do not wait to see if the symptoms will go away. Do not drive yourself to the hospital. Summary Hypertension is when the force of blood pumping through your arteries is too strong. If this condition is not controlled, it may put you at risk  for serious complications. Your personal target blood pressure may vary depending on your medical conditions, your age, and other factors. For most people, a normal blood pressure is less than 120/80. Hypertension is treated with lifestyle  changes, medicines, or a combination of both. Lifestyle changes include losing weight, eating a healthy, low-sodium diet, exercising more, and limiting alcohol. This information is not intended to replace advice given to you by your health care provider. Make sure you discuss any questions you have with your health care provider. Document Revised: 04/29/2021 Document Reviewed: 04/29/2021 Elsevier Patient Education  2024 [employer]    Edwina Barth, MD [facility]

## 2023-02-24 NOTE — Assessment & Plan Note (Signed)
Chronic stable condition. Advised to stay well-hydrated and avoid NSAIDs

## 2023-02-24 NOTE — Assessment & Plan Note (Signed)
Well-controlled hypertension after reducing the dose of labetalol due to hypotensive episodes at home. Continues valsartan 320 mg daily and labetalol 200 mg daily Cardiovascular risks associated with hypertension discussed Diet and nutrition discussed

## 2023-03-17 ENCOUNTER — Encounter: Payer: 59 | Admitting: Obstetrics & Gynecology

## 2023-03-21 ENCOUNTER — Other Ambulatory Visit (HOSPITAL_COMMUNITY): Payer: Self-pay

## 2023-03-22 ENCOUNTER — Other Ambulatory Visit: Payer: Self-pay

## 2023-04-05 ENCOUNTER — Ambulatory Visit: Payer: 59 | Admitting: Family Medicine

## 2023-04-13 ENCOUNTER — Ambulatory Visit
Admission: RE | Admit: 2023-04-13 | Discharge: 2023-04-13 | Disposition: A | Discharge status: Home / Self Care | Payer: 59 | Source: Ambulatory Visit | Attending: Emergency Medicine | Admitting: Emergency Medicine

## 2023-04-13 DIAGNOSIS — Z1231 Encounter for screening mammogram for malignant neoplasm of breast: Secondary | ICD-10-CM

## 2023-04-16 ENCOUNTER — Other Ambulatory Visit: Payer: Self-pay | Admitting: Emergency Medicine

## 2023-04-16 DIAGNOSIS — R928 Other abnormal and inconclusive findings on diagnostic imaging of breast: Secondary | ICD-10-CM

## 2023-04-21 ENCOUNTER — Other Ambulatory Visit (HOSPITAL_COMMUNITY): Payer: Self-pay

## 2023-04-22 ENCOUNTER — Other Ambulatory Visit: Payer: Self-pay

## 2023-04-26 ENCOUNTER — Ambulatory Visit: Payer: 59 | Admitting: Family Medicine

## 2023-04-27 ENCOUNTER — Ambulatory Visit
Admission: RE | Admit: 2023-04-27 | Discharge: 2023-04-27 | Disposition: A | Discharge status: Home / Self Care | Payer: 59 | Source: Ambulatory Visit | Attending: Emergency Medicine | Admitting: Emergency Medicine

## 2023-04-27 DIAGNOSIS — R928 Other abnormal and inconclusive findings on diagnostic imaging of breast: Secondary | ICD-10-CM

## 2023-05-24 ENCOUNTER — Other Ambulatory Visit (HOSPITAL_COMMUNITY): Payer: Self-pay

## 2023-06-23 ENCOUNTER — Other Ambulatory Visit: Payer: Self-pay | Admitting: Emergency Medicine

## 2023-06-23 ENCOUNTER — Other Ambulatory Visit: Payer: Self-pay

## 2023-06-23 ENCOUNTER — Other Ambulatory Visit (HOSPITAL_COMMUNITY): Payer: Self-pay

## 2023-06-23 DIAGNOSIS — E785 Hyperlipidemia, unspecified: Secondary | ICD-10-CM

## 2023-06-23 MED ORDER — ROSUVASTATIN CALCIUM 10 MG PO TABS
10.0000 mg | ORAL_TABLET | Freq: Every day | ORAL | 1 refills | Status: DC
  Filled 2023-06-23: qty 30, 30d supply, fill #0
  Filled 2023-07-25: qty 30, 30d supply, fill #1
  Filled 2023-08-23: qty 30, 30d supply, fill #2
  Filled 2023-09-23: qty 30, 30d supply, fill #3
  Filled 2023-10-25: qty 30, 30d supply, fill #4
  Filled 2023-11-22: qty 30, 30d supply, fill #5

## 2023-07-26 ENCOUNTER — Other Ambulatory Visit: Payer: Self-pay

## 2023-08-23 ENCOUNTER — Other Ambulatory Visit (HOSPITAL_COMMUNITY): Payer: Self-pay

## 2023-08-23 ENCOUNTER — Other Ambulatory Visit: Payer: Self-pay

## 2023-08-30 ENCOUNTER — Other Ambulatory Visit (HOSPITAL_COMMUNITY): Payer: Self-pay

## 2023-08-30 ENCOUNTER — Encounter: Payer: Self-pay | Admitting: Emergency Medicine

## 2023-08-30 ENCOUNTER — Other Ambulatory Visit: Payer: Self-pay

## 2023-08-30 ENCOUNTER — Ambulatory Visit: Payer: 59 | Admitting: Emergency Medicine

## 2023-08-30 VITALS — BP 124/78 | HR 68 | Temp 98.3°F | Ht 64.0 in | Wt 117.0 lb

## 2023-08-30 DIAGNOSIS — N1832 Chronic kidney disease, stage 3b: Secondary | ICD-10-CM | POA: Diagnosis not present

## 2023-08-30 DIAGNOSIS — I10 Essential (primary) hypertension: Secondary | ICD-10-CM | POA: Diagnosis not present

## 2023-08-30 DIAGNOSIS — E785 Hyperlipidemia, unspecified: Secondary | ICD-10-CM | POA: Diagnosis not present

## 2023-08-30 MED ORDER — VALACYCLOVIR HCL 1 G PO TABS
1000.0000 mg | ORAL_TABLET | Freq: Two times a day (BID) | ORAL | 3 refills | Status: DC
  Filled 2023-08-30: qty 20, 10d supply, fill #0
  Filled 2023-09-11: qty 20, 10d supply, fill #1
  Filled 2024-01-06 – 2024-01-10 (×3): qty 20, 10d supply, fill #2
  Filled 2024-02-21: qty 20, 10d supply, fill #3

## 2023-08-30 NOTE — Assessment & Plan Note (Signed)
 Chronic stable condition. Advised to stay well-hydrated and avoid NSAIDs

## 2023-08-30 NOTE — Patient Instructions (Signed)
 Hypertension, Adult High blood pressure (hypertension) is when the force of blood pumping through the arteries is too strong. The arteries are the blood vessels that carry blood from the heart throughout the body. Hypertension forces the heart to work harder to pump blood and may cause arteries to become narrow or stiff. Untreated or uncontrolled hypertension can lead to a heart attack, heart failure, a stroke, kidney disease, and other problems. A blood pressure reading consists of a higher number over a lower number. Ideally, your blood pressure should be below 120/80. The first ("top") number is called the systolic pressure. It is a measure of the pressure in your arteries as your heart beats. The second ("bottom") number is called the diastolic pressure. It is a measure of the pressure in your arteries as the heart relaxes. What are the causes? The exact cause of this condition is not known. There are some conditions that result in high blood pressure. What increases the risk? Certain factors may make you more likely to develop high blood pressure. Some of these risk factors are under your control, including: Smoking. Not getting enough exercise or physical activity. Being overweight. Having too much fat, sugar, calories, or salt (sodium) in your diet. Drinking too much alcohol. Other risk factors include: Having a personal history of heart disease, diabetes, high cholesterol, or kidney disease. Stress. Having a family history of high blood pressure and high cholesterol. Having obstructive sleep apnea. Age. The risk increases with age. What are the signs or symptoms? High blood pressure may not cause symptoms. Very high blood pressure (hypertensive crisis) may cause: Headache. Fast or irregular heartbeats (palpitations). Shortness of breath. Nosebleed. Nausea and vomiting. Vision changes. Severe chest pain, dizziness, and seizures. How is this diagnosed? This condition is diagnosed by  measuring your blood pressure while you are seated, with your arm resting on a flat surface, your legs uncrossed, and your feet flat on the floor. The cuff of the blood pressure monitor will be placed directly against the skin of your upper arm at the level of your heart. Blood pressure should be measured at least twice using the same arm. Certain conditions can cause a difference in blood pressure between your right and left arms. If you have a high blood pressure reading during one visit or you have normal blood pressure with other risk factors, you may be asked to: Return on a different day to have your blood pressure checked again. Monitor your blood pressure at home for 1 week or longer. If you are diagnosed with hypertension, you may have other blood or imaging tests to help your health care provider understand your overall risk for other conditions. How is this treated? This condition is treated by making healthy lifestyle changes, such as eating healthy foods, exercising more, and reducing your alcohol intake. You may be referred for counseling on a healthy diet and physical activity. Your health care provider may prescribe medicine if lifestyle changes are not enough to get your blood pressure under control and if: Your systolic blood pressure is above 130. Your diastolic blood pressure is above 80. Your personal target blood pressure may vary depending on your medical conditions, your age, and other factors. Follow these instructions at home: Eating and drinking  Eat a diet that is high in fiber and potassium, and low in sodium, added sugar, and fat. An example of this eating plan is called the DASH diet. DASH stands for Dietary Approaches to Stop Hypertension. To eat this way: Eat  plenty of fresh fruits and vegetables. Try to fill one half of your plate at each meal with fruits and vegetables. Eat whole grains, such as whole-wheat pasta, brown rice, or whole-grain bread. Fill about one  fourth of your plate with whole grains. Eat or drink low-fat dairy products, such as skim milk or low-fat yogurt. Avoid fatty cuts of meat, processed or cured meats, and poultry with skin. Fill about one fourth of your plate with lean proteins, such as fish, chicken without skin, beans, eggs, or tofu. Avoid pre-made and processed foods. These tend to be higher in sodium, added sugar, and fat. Reduce your daily sodium intake. Many people with hypertension should eat less than 1,500 mg of sodium a day. Do not drink alcohol if: Your health care provider tells you not to drink. You are pregnant, may be pregnant, or are planning to become pregnant. If you drink alcohol: Limit how much you have to: 0-1 drink a day for women. 0-2 drinks a day for men. Know how much alcohol is in your drink. In the [place] one drink equals one 12 oz bottle of beer (355 mL), one 5 oz glass of wine (148 mL), or one 1 oz glass of hard liquor (44 mL). Lifestyle  Work with your health care provider to maintain a healthy body weight or to lose weight. Ask what an ideal weight is for you. Get at least 30 minutes of exercise that causes your heart to beat faster (aerobic exercise) most days of the week. Activities may include walking, swimming, or biking. Include exercise to strengthen your muscles (resistance exercise), such as Pilates or lifting weights, as part of your weekly exercise routine. Try to do these types of exercises for 30 minutes at least 3 days a week. Do not use any products that contain nicotine or tobacco. These products include cigarettes, chewing tobacco, and vaping devices, such as e-cigarettes. If you need help quitting, ask your health care provider. Monitor your blood pressure at home as told by your health care provider. Keep all follow-up visits. This is important. Medicines Take over-the-counter and prescription medicines only as told by your health care provider. Follow directions carefully. Blood  pressure medicines must be taken as prescribed. Do not skip doses of blood pressure medicine. Doing this puts you at risk for problems and can make the medicine less effective. Ask your health care provider about side effects or reactions to medicines that you should watch for. Contact a health care provider if you: Think you are having a reaction to a medicine you are taking. Have headaches that keep coming back (recurring). Feel dizzy. Have swelling in your ankles. Have trouble with your vision. Get help right away if you: Develop a severe headache or confusion. Have unusual weakness or numbness. Feel faint. Have severe pain in your chest or abdomen. Vomit repeatedly. Have trouble breathing. These symptoms may be an emergency. Get help right away. Call 911. Do not wait to see if the symptoms will go away. Do not drive yourself to the hospital. Summary Hypertension is when the force of blood pumping through your arteries is too strong. If this condition is not controlled, it may put you at risk for serious complications. Your personal target blood pressure may vary depending on your medical conditions, your age, and other factors. For most people, a normal blood pressure is less than 120/80. Hypertension is treated with lifestyle changes, medicines, or a combination of both. Lifestyle changes include losing weight, eating a healthy,  low-sodium diet, exercising more, and limiting alcohol. This information is not intended to replace advice given to you by your health care provider. Make sure you discuss any questions you have with your health care provider. Document Revised: 04/29/2021 Document Reviewed: 04/29/2021 Elsevier Patient Education  2024 [employer].

## 2023-08-30 NOTE — Assessment & Plan Note (Signed)
Stable chronic condition Diet and nutrition discussed Continue rosuvastatin 10 mg daily.

## 2023-08-30 NOTE — Assessment & Plan Note (Signed)
 BP Readings from Last 3 Encounters:  08/30/23 124/78  02/24/23 130/80  09/01/22 (!) 167/87  Well-controlled hypertension after reducing the dose of labetalol due to hypotensive episodes at home. Continues valsartan 320 mg daily and labetalol 200 mg daily Cardiovascular risks associated with hypertension discussed Diet and nutrition discussed

## 2023-08-30 NOTE — Progress Notes (Signed)
 Robin Henderson 58 y.o.   Chief Complaint  Patient presents with   Follow-up    6 month f/u for HTN. Patient has no other concerns.     HISTORY OF PRESENT ILLNESS: This is a 58 y.o. female A1A here for 55-month follow-up of hypertension Normal blood pressure readings at home. Overall doing well. Has no complaints or any other medical concerns today. BP Readings from Last 3 Encounters:  08/30/23 124/78  02/24/23 130/80  09/01/22 (!) 167/87     HPI   Prior to Admission medications   Medication Sig Start Date End Date Taking? Authorizing Provider  labetalol (NORMODYNE) 200 MG tablet Take 1 tablet (200 mg total) by mouth 2 (two) times daily. Patient taking differently: Take 200 mg by mouth once. 12/22/22  Yes   rosuvastatin (CRESTOR) 10 MG tablet Take 1 tablet (10 mg total) by mouth daily. 06/23/23  Yes Drayven Marchena, Eilleen Kempf, MD  valsartan (DIOVAN) 320 MG tablet Take 1 tablet (320 mg total) by mouth daily. 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  Almost slit wrists and almost  jumped off a parking deck    Past Surgical History:  Procedure Laterality Date   COLONOSCOPY  02/2021   EXTERNAL EAR SURGERY  1995   correct earring hole   HEAD & NECK SKIN LESION EXCISIONAL BIOPSY  1995   lip lesion   WISDOM TOOTH EXTRACTION      Social History   Socioeconomic History   Marital status: Single    Spouse name: Not on file   Number of children: Not on file   Years of education: Not on file   Highest education level: Master's degree (e.g., MA, MS, MEng, MEd, MSW, MBA)  Occupational History   Not on file  Tobacco Use   Smoking status: Never   Smokeless tobacco: Never  Vaping Use   Vaping status: Never Used  Substance and Sexual Activity   Alcohol use: Yes    Alcohol/week: 1.0 standard drink of alcohol    Types: 1 Standard drinks or equivalent per week   Drug use: No   Sexual activity: Yes    Comment: N/A insurance questions  Other Topics Concern   Not on file  Social History Narrative   Works.  Moving to new apartment to be nearer her boyfriend.     Social Drivers of Corporate investment banker Strain: Low Risk  (08/26/2023)   Overall Financial Resource Strain (CARDIA)    Difficulty of Paying Living Expenses: Not hard at  all  Food Insecurity: No Food Insecurity (08/26/2023)   Hunger Vital Sign    Worried About Running Out of Food in the Last Year: Never true    Ran Out of Food in the Last Year: Never true  Transportation Needs: No Transportation Needs (08/26/2023)   PRAPARE - Administrator, Civil Service (Medical): No    Lack of Transportation (Non-Medical): No  Physical Activity: Insufficiently Active (08/26/2023)   Exercise Vital Sign    Days of Exercise per Week: 4 days    Minutes of Exercise per Session: 30 min  Stress: No Stress Concern Present (08/26/2023)   [employer] of Occupational Health - Occupational Stress Questionnaire    Feeling of Stress : Only a little  Social Connections: Unknown (08/26/2023)   Social Connection  and Isolation Panel [NHANES]    Frequency of Communication with Friends and Family: Twice a week    Frequency of Social Gatherings with Friends and Family: Once a week    Attends Religious Services: 1 to 4 times per year    Active Member of [employer] or Organizations: No    Attends Engineer, structural: Not on file    Marital Status: Patient declined  Catering manager Violence: Not on file    Family History  Problem Relation Age of Onset   Heart disease Mother    Alcohol abuse Mother    Epilepsy Sister    Depression Sister        possible suicide attempt   Epilepsy Brother    Depression Brother        with suicide attempts   Alcohol abuse Maternal Grandfather    Colon polyps Neg Hx    Colon cancer Neg Hx    Esophageal cancer Neg Hx    Rectal cancer Neg Hx    Stomach cancer Neg Hx      Review of Systems  Constitutional: Negative.  Negative for chills and fever.  HENT: Negative.  Negative for congestion and sore throat.   Respiratory: Negative.  Negative for cough and shortness of breath.   Cardiovascular: Negative.  Negative for chest pain and palpitations.  Gastrointestinal:  Negative for abdominal pain, diarrhea, nausea and vomiting.  Genitourinary: Negative.  Negative for dysuria and hematuria.  Skin: Negative.  Negative for rash.  Neurological: Negative.  Negative for dizziness and headaches.  All other systems reviewed and are negative.   Vitals:   08/30/23 0950  BP: 124/78  Pulse: 68  Temp: 98.3 F (36.8 C)  SpO2: 97%    Physical Exam Vitals reviewed.  Constitutional:      Appearance: Normal appearance.  HENT:     Head: Normocephalic.  Eyes:     Extraocular Movements: Extraocular movements intact.  Cardiovascular:     Rate and Rhythm: Normal rate.  Pulmonary:     Effort: Pulmonary effort is normal.  Skin:    General: Skin is warm and dry.  Neurological:     Mental Status: She is alert and oriented to person, place, and time.  Psychiatric:         Mood and Affect: Mood normal.        Behavior: Behavior normal.      ASSESSMENT & PLAN: A total of 42 minutes was spent with the patient and counseling/coordination of care regarding preparing for this visit, review of most recent office visit notes, review of multiple chronic medical conditions and their management, cardiovascular risks associated with hypertension, review of all medications, review of  most recent bloodwork results, review of health maintenance items, education on nutrition, prognosis, documentation, and need for follow up. .  Problem List Items Addressed This Visit       Cardiovascular and Mediastinum   Essential hypertension - Primary   BP Readings from Last 3 Encounters:  08/30/23 124/78  02/24/23 130/80  09/01/22 (!) 167/87  Well-controlled hypertension after reducing the dose of labetalol due to hypotensive episodes at home. Continues valsartan 320 mg daily and labetalol 200 mg daily Cardiovascular risks associated with hypertension discussed Diet and nutrition discussed         Genitourinary   Stage 3b chronic kidney disease (HCC)   Chronic stable condition Advised to stay well-hydrated and avoid NSAIDs        Other   Dyslipidemia   Stable chronic condition Diet and nutrition discussed Continue rosuvastatin 10 mg daily      Patient Instructions  Hypertension, Adult High blood pressure (hypertension) is when the force of blood pumping through the arteries is too strong. The arteries are the blood vessels that carry blood from the heart throughout the body. Hypertension forces the heart to work harder to pump blood and may cause arteries to become narrow or stiff. Untreated or uncontrolled hypertension can lead to a heart attack, heart failure, a stroke, kidney disease, and other problems. A blood pressure reading consists of a higher number over a lower number. Ideally, your blood pressure should be below 120/80. The first ("top") number is  called the systolic pressure. It is a measure of the pressure in your arteries as your heart beats. The second ("bottom") number is called the diastolic pressure. It is a measure of the pressure in your arteries as the heart relaxes. What are the causes? The exact cause of this condition is not known. There are some conditions that result in high blood pressure. What increases the risk? Certain factors may make you more likely to develop high blood pressure. Some of these risk factors are under your control, including: Smoking. Not getting enough exercise or physical activity. Being overweight. Having too much fat, sugar, calories, or salt (sodium) in your diet. Drinking too much alcohol. Other risk factors include: Having a personal history of heart disease, diabetes, high cholesterol, or kidney disease. Stress. Having a family history of high blood pressure and high cholesterol. Having obstructive sleep apnea. Age. The risk increases with age. What are the signs or symptoms? High blood pressure may not cause symptoms. Very high blood pressure (hypertensive crisis) may cause: Headache. Fast or irregular heartbeats (palpitations). Shortness of breath. Nosebleed. Nausea and vomiting. Vision changes. Severe chest pain, dizziness, and seizures. How is this diagnosed? This condition is diagnosed by measuring your blood pressure while you are seated, with your arm resting on a flat surface, your legs uncrossed, and your feet flat on the floor. The cuff of the blood pressure monitor will be placed directly against the skin of your upper arm at the level of your heart. Blood pressure should be measured at least twice using the same arm. Certain conditions can cause a difference in blood pressure between your right and left arms. If you have a high blood pressure reading during one visit or you have normal blood pressure with other risk factors, you may be asked to: Return on a different day to  have your blood pressure checked again. Monitor your blood pressure at home for 1 week or longer. If you are diagnosed with hypertension, you may have other blood  or imaging tests to help your health care provider understand your overall risk for other conditions. How is this treated? This condition is treated by making healthy lifestyle changes, such as eating healthy foods, exercising more, and reducing your alcohol intake. You may be referred for counseling on a healthy diet and physical activity. Your health care provider may prescribe medicine if lifestyle changes are not enough to get your blood pressure under control and if: Your systolic blood pressure is above 130. Your diastolic blood pressure is above 80. Your personal target blood pressure may vary depending on your medical conditions, your age, and other factors. Follow these instructions at home: Eating and drinking  Eat a diet that is high in fiber and potassium, and low in sodium, added sugar, and fat. An example of this eating plan is called the DASH diet. DASH stands for Dietary Approaches to Stop Hypertension. To eat this way: Eat plenty of fresh fruits and vegetables. Try to fill one half of your plate at each meal with fruits and vegetables. Eat whole grains, such as whole-wheat pasta, brown rice, or whole-grain bread. Fill about one fourth of your plate with whole grains. Eat or drink low-fat dairy products, such as skim milk or low-fat yogurt. Avoid fatty cuts of meat, processed or cured meats, and poultry with skin. Fill about one fourth of your plate with lean proteins, such as fish, chicken without skin, beans, eggs, or tofu. Avoid pre-made and processed foods. These tend to be higher in sodium, added sugar, and fat. Reduce your daily sodium intake. Many people with hypertension should eat less than 1,500 mg of sodium a day. Do not drink alcohol if: Your health care provider tells you not to drink. You are pregnant, may  be pregnant, or are planning to become pregnant. If you drink alcohol: Limit how much you have to: 0-1 drink a day for women. 0-2 drinks a day for men. Know how much alcohol is in your drink. In the [place] one drink equals one 12 oz bottle of beer (355 mL), one 5 oz glass of wine (148 mL), or one 1 oz glass of hard liquor (44 mL). Lifestyle  Work with your health care provider to maintain a healthy body weight or to lose weight. Ask what an ideal weight is for you. Get at least 30 minutes of exercise that causes your heart to beat faster (aerobic exercise) most days of the week. Activities may include walking, swimming, or biking. Include exercise to strengthen your muscles (resistance exercise), such as Pilates or lifting weights, as part of your weekly exercise routine. Try to do these types of exercises for 30 minutes at least 3 days a week. Do not use any products that contain nicotine or tobacco. These products include cigarettes, chewing tobacco, and vaping devices, such as e-cigarettes. If you need help quitting, ask your health care provider. Monitor your blood pressure at home as told by your health care provider. Keep all follow-up visits. This is important. Medicines Take over-the-counter and prescription medicines only as told by your health care provider. Follow directions carefully. Blood pressure medicines must be taken as prescribed. Do not skip doses of blood pressure medicine. Doing this puts you at risk for problems and can make the medicine less effective. Ask your health care provider about side effects or reactions to medicines that you should watch for. Contact a health care provider if you: Think you are having a reaction to a medicine you are taking. Have headaches  that keep coming back (recurring). Feel dizzy. Have swelling in your ankles. Have trouble with your vision. Get help right away if you: Develop a severe headache or confusion. Have unusual weakness or  numbness. Feel faint. Have severe pain in your chest or abdomen. Vomit repeatedly. Have trouble breathing. These symptoms may be an emergency. Get help right away. Call 911. Do not wait to see if the symptoms will go away. Do not drive yourself to the hospital. Summary Hypertension is when the force of blood pumping through your arteries is too strong. If this condition is not controlled, it may put you at risk for serious complications. Your personal target blood pressure may vary depending on your medical conditions, your age, and other factors. For most people, a normal blood pressure is less than 120/80. Hypertension is treated with lifestyle changes, medicines, or a combination of both. Lifestyle changes include losing weight, eating a healthy, low-sodium diet, exercising more, and limiting alcohol. This information is not intended to replace advice given to you by your health care provider. Make sure you discuss any questions you have with your health care provider. Document Revised: 04/29/2021 Document Reviewed: 04/29/2021 Elsevier Patient Education  2024 Elsevier Inc.    Edwina Barth, MD [facility]

## 2023-09-06 ENCOUNTER — Other Ambulatory Visit (HOSPITAL_COMMUNITY): Payer: Self-pay

## 2023-09-10 ENCOUNTER — Other Ambulatory Visit (HOSPITAL_COMMUNITY): Payer: Self-pay

## 2023-09-11 ENCOUNTER — Other Ambulatory Visit (HOSPITAL_COMMUNITY): Payer: Self-pay

## 2023-09-13 ENCOUNTER — Other Ambulatory Visit: Payer: Self-pay

## 2023-09-13 ENCOUNTER — Other Ambulatory Visit (HOSPITAL_COMMUNITY): Payer: Self-pay

## 2023-09-13 ENCOUNTER — Encounter: Payer: Self-pay | Admitting: Emergency Medicine

## 2023-09-13 MED ORDER — LABETALOL HCL 200 MG PO TABS
200.0000 mg | ORAL_TABLET | Freq: Two times a day (BID) | ORAL | 3 refills | Status: DC
  Filled 2023-09-13: qty 60, 30d supply, fill #0

## 2023-09-14 ENCOUNTER — Other Ambulatory Visit: Payer: Self-pay | Admitting: Radiology

## 2023-09-14 DIAGNOSIS — I10 Essential (primary) hypertension: Secondary | ICD-10-CM

## 2023-09-14 MED ORDER — LABETALOL HCL 200 MG PO TABS
200.0000 mg | ORAL_TABLET | Freq: Two times a day (BID) | ORAL | 3 refills | Status: DC
  Filled 2023-10-25: qty 60, 30d supply, fill #0
  Filled 2023-12-22: qty 60, 30d supply, fill #1
  Filled 2024-02-21: qty 60, 30d supply, fill #2
  Filled 2024-04-17: qty 60, 30d supply, fill #3

## 2023-09-23 ENCOUNTER — Other Ambulatory Visit (HOSPITAL_COMMUNITY): Payer: Self-pay

## 2023-09-23 ENCOUNTER — Other Ambulatory Visit: Payer: Self-pay

## 2023-09-24 ENCOUNTER — Other Ambulatory Visit (HOSPITAL_COMMUNITY): Payer: Self-pay

## 2023-09-28 ENCOUNTER — Other Ambulatory Visit (HOSPITAL_COMMUNITY): Payer: Self-pay

## 2023-10-25 ENCOUNTER — Other Ambulatory Visit: Payer: Self-pay

## 2023-10-25 ENCOUNTER — Other Ambulatory Visit (HOSPITAL_COMMUNITY): Payer: Self-pay

## 2023-11-22 ENCOUNTER — Other Ambulatory Visit: Payer: Self-pay | Admitting: Emergency Medicine

## 2023-11-22 ENCOUNTER — Other Ambulatory Visit (HOSPITAL_COMMUNITY): Payer: Self-pay

## 2023-11-22 MED ORDER — VALSARTAN 320 MG PO TABS
320.0000 mg | ORAL_TABLET | Freq: Every day | ORAL | 3 refills | Status: AC
  Filled 2023-11-22: qty 30, 30d supply, fill #0
  Filled 2023-12-22: qty 30, 30d supply, fill #1
  Filled 2024-01-20: qty 30, 30d supply, fill #2
  Filled 2024-02-21: qty 30, 30d supply, fill #3
  Filled 2024-03-21: qty 30, 30d supply, fill #4
  Filled 2024-04-17: qty 30, 30d supply, fill #5
  Filled 2024-05-22: qty 30, 30d supply, fill #6
  Filled 2024-06-21: qty 30, 30d supply, fill #7
  Filled 2024-07-17: qty 30, 30d supply, fill #8

## 2023-12-22 ENCOUNTER — Other Ambulatory Visit: Payer: Self-pay | Admitting: Emergency Medicine

## 2023-12-22 ENCOUNTER — Other Ambulatory Visit: Payer: Self-pay

## 2023-12-22 DIAGNOSIS — E785 Hyperlipidemia, unspecified: Secondary | ICD-10-CM

## 2023-12-22 MED ORDER — ROSUVASTATIN CALCIUM 10 MG PO TABS
10.0000 mg | ORAL_TABLET | Freq: Every day | ORAL | 1 refills | Status: DC
  Filled 2023-12-22: qty 30, 30d supply, fill #0
  Filled 2024-01-20: qty 30, 30d supply, fill #1
  Filled 2024-02-21: qty 30, 30d supply, fill #2
  Filled 2024-03-21: qty 30, 30d supply, fill #3
  Filled 2024-04-17: qty 30, 30d supply, fill #4
  Filled 2024-05-22: qty 30, 30d supply, fill #5

## 2024-01-06 ENCOUNTER — Other Ambulatory Visit (HOSPITAL_COMMUNITY): Payer: Self-pay

## 2024-01-10 ENCOUNTER — Other Ambulatory Visit (HOSPITAL_COMMUNITY): Payer: Self-pay

## 2024-01-10 ENCOUNTER — Other Ambulatory Visit: Payer: Self-pay

## 2024-01-18 ENCOUNTER — Other Ambulatory Visit (HOSPITAL_COMMUNITY): Payer: Self-pay

## 2024-01-20 ENCOUNTER — Other Ambulatory Visit: Payer: Self-pay

## 2024-02-21 ENCOUNTER — Other Ambulatory Visit (HOSPITAL_COMMUNITY): Payer: Self-pay

## 2024-02-28 ENCOUNTER — Encounter: Payer: Self-pay | Admitting: Emergency Medicine

## 2024-02-28 ENCOUNTER — Ambulatory Visit: Payer: 59 | Admitting: Emergency Medicine

## 2024-02-28 VITALS — BP 132/82 | HR 76 | Temp 98.3°F | Ht 64.0 in | Wt 115.0 lb

## 2024-02-28 DIAGNOSIS — L723 Sebaceous cyst: Secondary | ICD-10-CM

## 2024-02-28 DIAGNOSIS — E785 Hyperlipidemia, unspecified: Secondary | ICD-10-CM | POA: Diagnosis not present

## 2024-02-28 DIAGNOSIS — N1832 Chronic kidney disease, stage 3b: Secondary | ICD-10-CM

## 2024-02-28 DIAGNOSIS — F32A Depression, unspecified: Secondary | ICD-10-CM

## 2024-02-28 DIAGNOSIS — F419 Anxiety disorder, unspecified: Secondary | ICD-10-CM | POA: Diagnosis not present

## 2024-02-28 DIAGNOSIS — I10 Essential (primary) hypertension: Secondary | ICD-10-CM | POA: Diagnosis not present

## 2024-02-28 LAB — URINALYSIS, ROUTINE W REFLEX MICROSCOPIC
Bilirubin Urine: NEGATIVE
Ketones, ur: NEGATIVE
Leukocytes,Ua: NEGATIVE
Nitrite: NEGATIVE
Specific Gravity, Urine: <=1.005 — AB (ref 1.000–1.030)
Urine Glucose: NEGATIVE
Urobilinogen, UA: 0.2 (ref 0.0–1.0)
WBC, UA: NONE SEEN (ref 0–?)
pH: 6 (ref 5.0–8.0)

## 2024-02-28 LAB — LIPID PANEL
Cholesterol: 178 mg/dL (ref 0–200)
HDL: 109.1 mg/dL (ref >39.00)
LDL Cholesterol: 49 mg/dL (ref 0–99)
NonHDL: 69.25
Total CHOL/HDL Ratio: 2
Triglycerides: 103 mg/dL (ref 0.0–149.0)
VLDL: 20.6 mg/dL (ref 0.0–40.0)

## 2024-02-28 LAB — COMPREHENSIVE METABOLIC PANEL WITH GFR
ALT: 18 U/L (ref 0–35)
AST: 34 U/L (ref 0–37)
Albumin: 4.6 g/dL (ref 3.5–5.2)
Alkaline Phosphatase: 59 U/L (ref 39–117)
BUN: 27 mg/dL — ABNORMAL HIGH (ref 6–23)
CO2: 24 meq/L (ref 19–32)
Calcium: 9.5 mg/dL (ref 8.4–10.5)
Chloride: 96 meq/L (ref 96–112)
Creatinine, Ser: 1.47 mg/dL — ABNORMAL HIGH (ref 0.40–1.20)
GFR: 39.76 mL/min — ABNORMAL LOW (ref >60.00)
Glucose, Bld: 90 mg/dL (ref 70–99)
Potassium: 5.1 meq/L (ref 3.5–5.1)
Sodium: 129 meq/L — ABNORMAL LOW (ref 135–145)
Total Bilirubin: 0.9 mg/dL (ref 0.2–1.2)
Total Protein: 7.3 g/dL (ref 6.0–8.3)

## 2024-02-28 LAB — MICROALBUMIN / CREATININE URINE RATIO
Creatinine,U: 67.9 mg/dL
Microalb Creat Ratio: 229.7 mg/g — ABNORMAL HIGH (ref 0.0–30.0)
Microalb, Ur: 15.6 mg/dL — ABNORMAL HIGH (ref 0.0–1.9)

## 2024-02-28 LAB — CBC WITH DIFFERENTIAL/PLATELET
Basophils Absolute: 0 K/uL (ref 0.0–0.1)
Basophils Relative: 0.8 % (ref 0.0–3.0)
Eosinophils Absolute: 0.1 K/uL (ref 0.0–0.7)
Eosinophils Relative: 1.1 % (ref 0.0–5.0)
HCT: 28.8 % — ABNORMAL LOW (ref 36.0–46.0)
Hemoglobin: 9.7 g/dL — ABNORMAL LOW (ref 12.0–15.0)
Lymphocytes Relative: 28 % (ref 12.0–46.0)
Lymphs Abs: 1.6 K/uL (ref 0.7–4.0)
MCHC: 33.5 g/dL (ref 30.0–36.0)
MCV: 99.6 fl (ref 78.0–100.0)
Monocytes Absolute: 0.4 K/uL (ref 0.1–1.0)
Monocytes Relative: 7.6 % (ref 3.0–12.0)
Neutro Abs: 3.5 K/uL (ref 1.4–7.7)
Neutrophils Relative %: 62.5 % (ref 43.0–77.0)
Platelets: 235 K/uL (ref 150.0–400.0)
RBC: 2.89 Mil/uL — ABNORMAL LOW (ref 3.87–5.11)
RDW: 13.6 % (ref 11.5–15.5)
WBC: 5.7 K/uL (ref 4.0–10.5)

## 2024-02-28 LAB — HEMOGLOBIN A1C: Hgb A1c MFr Bld: 5.9 % (ref 4.6–6.5)

## 2024-02-28 NOTE — Assessment & Plan Note (Addendum)
 BP Readings from Last 3 Encounters:  02/28/24 132/82  08/30/23 124/78  02/24/23 130/80  Well-controlled hypertension Continue labetalol  200 mg twice a day and valsartan  320 mg daily Cardiovascular risks associated with hypertension discussed Diet and nutrition discussed

## 2024-02-28 NOTE — Assessment & Plan Note (Signed)
 Clinically benign multiple cysts.  Mostly left lower extremity Recommend dermatology evaluation No signs of infection.  No concerns.

## 2024-02-28 NOTE — Assessment & Plan Note (Addendum)
 Stable chronic condition Diet and nutrition discussed Continue rosuvastatin  10 mg daily Lipid profile done today The 10-year ASCVD risk score (Arnett DK, et al., 2019) is: 2.4%   Values used to calculate the score:     Age: 58 years     Clincally relevant sex: Female     Is Non-Hispanic African American: No     Diabetic: No     Tobacco smoker: No     Systolic Blood Pressure: 124 mmHg     Is BP treated: Yes     HDL Cholesterol: 98.5 mg/dL     Total Cholesterol: 295 mg/dL

## 2024-02-28 NOTE — Patient Instructions (Signed)
 Hypertension, Adult High blood pressure (hypertension) is when the force of blood pumping through the arteries is too strong. The arteries are the blood vessels that carry blood from the heart throughout the body. Hypertension forces the heart to work harder to pump blood and may cause arteries to become narrow or stiff. Untreated or uncontrolled hypertension can lead to a heart attack, heart failure, a stroke, kidney disease, and other problems. A blood pressure reading consists of a higher number over a lower number. Ideally, your blood pressure should be below 120/80. The first ("top") number is called the systolic pressure. It is a measure of the pressure in your arteries as your heart beats. The second ("bottom") number is called the diastolic pressure. It is a measure of the pressure in your arteries as the heart relaxes. What are the causes? The exact cause of this condition is not known. There are some conditions that result in high blood pressure. What increases the risk? Certain factors may make you more likely to develop high blood pressure. Some of these risk factors are under your control, including: Smoking. Not getting enough exercise or physical activity. Being overweight. Having too much fat, sugar, calories, or salt (sodium) in your diet. Drinking too much alcohol. Other risk factors include: Having a personal history of heart disease, diabetes, high cholesterol, or kidney disease. Stress. Having a family history of high blood pressure and high cholesterol. Having obstructive sleep apnea. Age. The risk increases with age. What are the signs or symptoms? High blood pressure may not cause symptoms. Very high blood pressure (hypertensive crisis) may cause: Headache. Fast or irregular heartbeats (palpitations). Shortness of breath. Nosebleed. Nausea and vomiting. Vision changes. Severe chest pain, dizziness, and seizures. How is this diagnosed? This condition is diagnosed by  measuring your blood pressure while you are seated, with your arm resting on a flat surface, your legs uncrossed, and your feet flat on the floor. The cuff of the blood pressure monitor will be placed directly against the skin of your upper arm at the level of your heart. Blood pressure should be measured at least twice using the same arm. Certain conditions can cause a difference in blood pressure between your right and left arms. If you have a high blood pressure reading during one visit or you have normal blood pressure with other risk factors, you may be asked to: Return on a different day to have your blood pressure checked again. Monitor your blood pressure at home for 1 week or longer. If you are diagnosed with hypertension, you may have other blood or imaging tests to help your health care provider understand your overall risk for other conditions. How is this treated? This condition is treated by making healthy lifestyle changes, such as eating healthy foods, exercising more, and reducing your alcohol intake. You may be referred for counseling on a healthy diet and physical activity. Your health care provider may prescribe medicine if lifestyle changes are not enough to get your blood pressure under control and if: Your systolic blood pressure is above 130. Your diastolic blood pressure is above 80. Your personal target blood pressure may vary depending on your medical conditions, your age, and other factors. Follow these instructions at home: Eating and drinking  Eat a diet that is high in fiber and potassium, and low in sodium, added sugar, and fat. An example of this eating plan is called the DASH diet. DASH stands for Dietary Approaches to Stop Hypertension. To eat this way: Eat  plenty of fresh fruits and vegetables. Try to fill one half of your plate at each meal with fruits and vegetables. Eat whole grains, such as whole-wheat pasta, brown rice, or whole-grain bread. Fill about one  fourth of your plate with whole grains. Eat or drink low-fat dairy products, such as skim milk or low-fat yogurt. Avoid fatty cuts of meat, processed or cured meats, and poultry with skin. Fill about one fourth of your plate with lean proteins, such as fish, chicken without skin, beans, eggs, or tofu. Avoid pre-made and processed foods. These tend to be higher in sodium, added sugar, and fat. Reduce your daily sodium intake. Many people with hypertension should eat less than 1,500 mg of sodium a day. Do not drink alcohol if: Your health care provider tells you not to drink. You are pregnant, may be pregnant, or are planning to become pregnant. If you drink alcohol: Limit how much you have to: 0-1 drink a day for women. 0-2 drinks a day for men. Know how much alcohol is in your drink. In the [place] one drink equals one 12 oz bottle of beer (355 mL), one 5 oz glass of wine (148 mL), or one 1 oz glass of hard liquor (44 mL). Lifestyle  Work with your health care provider to maintain a healthy body weight or to lose weight. Ask what an ideal weight is for you. Get at least 30 minutes of exercise that causes your heart to beat faster (aerobic exercise) most days of the week. Activities may include walking, swimming, or biking. Include exercise to strengthen your muscles (resistance exercise), such as Pilates or lifting weights, as part of your weekly exercise routine. Try to do these types of exercises for 30 minutes at least 3 days a week. Do not use any products that contain nicotine or tobacco. These products include cigarettes, chewing tobacco, and vaping devices, such as e-cigarettes. If you need help quitting, ask your health care provider. Monitor your blood pressure at home as told by your health care provider. Keep all follow-up visits. This is important. Medicines Take over-the-counter and prescription medicines only as told by your health care provider. Follow directions carefully. Blood  pressure medicines must be taken as prescribed. Do not skip doses of blood pressure medicine. Doing this puts you at risk for problems and can make the medicine less effective. Ask your health care provider about side effects or reactions to medicines that you should watch for. Contact a health care provider if you: Think you are having a reaction to a medicine you are taking. Have headaches that keep coming back (recurring). Feel dizzy. Have swelling in your ankles. Have trouble with your vision. Get help right away if you: Develop a severe headache or confusion. Have unusual weakness or numbness. Feel faint. Have severe pain in your chest or abdomen. Vomit repeatedly. Have trouble breathing. These symptoms may be an emergency. Get help right away. Call 911. Do not wait to see if the symptoms will go away. Do not drive yourself to the hospital. Summary Hypertension is when the force of blood pumping through your arteries is too strong. If this condition is not controlled, it may put you at risk for serious complications. Your personal target blood pressure may vary depending on your medical conditions, your age, and other factors. For most people, a normal blood pressure is less than 120/80. Hypertension is treated with lifestyle changes, medicines, or a combination of both. Lifestyle changes include losing weight, eating a healthy,  low-sodium diet, exercising more, and limiting alcohol. This information is not intended to replace advice given to you by your health care provider. Make sure you discuss any questions you have with your health care provider. Document Revised: 04/29/2021 Document Reviewed: 04/29/2021 Elsevier Patient Education  2024 [employer].

## 2024-02-28 NOTE — Progress Notes (Signed)
 Robin Henderson 58 y.o.   Chief Complaint  Patient presents with   Cyst    4 Cyst on the left leg, pt is not sure is it cyst or something else, there was some swelling and some pain which has gone away     HISTORY OF PRESENT ILLNESS: This is a 58 y.o. female here for 10-month follow-up of hypertension. Blood pressure readings at home mostly normal with a couple of elevated readings related to stress Also complaining of small cysts on her lower left leg.  Swelling and pain gone now.  Planning on following up with dermatologist soon. No other complaints or medical concerns today.  HPI   Prior to Admission medications   Medication Sig Start Date End Date Taking? Authorizing Provider  labetalol  (NORMODYNE ) 200 MG tablet Take 1 tablet (200 mg total) by mouth 2 (two) times daily. 09/14/23  Yes Piero Mustard, Emil Schanz, MD  rosuvastatin  (CRESTOR ) 10 MG tablet Take 1 tablet (10 mg total) by mouth daily. 12/22/23  Yes Chrishun Scheer, Emil Schanz, MD  valACYclovir  (VALTREX ) 1000 MG tablet Take 1 tablet (1,000 mg total) by mouth 2 (two) times daily. 08/30/23  Yes Arria Naim, Emil Schanz, MD  valsartan  (DIOVAN ) 320 MG tablet Take 1 tablet (320 mg total) by mouth daily. 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  Almost slit wrists and almost jumped off a parking deck    Past Surgical History:  Procedure Laterality Date   COLONOSCOPY  02/2021   EXTERNAL EAR SURGERY  1995   correct earring hole   HEAD & NECK SKIN LESION EXCISIONAL BIOPSY  1995   lip lesion   WISDOM TOOTH EXTRACTION      Social History   Socioeconomic History   Marital status: Single    Spouse name: Not on file   Number of children: Not on file   Years of education: Not on file   Highest education level: Master's degree (e.g., MA, MS, MEng, MEd, MSW, MBA)  Occupational History   Not on file  Tobacco Use   Smoking status: Never   Smokeless tobacco: Never  Vaping Use   Vaping status: Never Used  Substance and Sexual Activity   Alcohol use: Yes    Alcohol/week: 1.0 standard drink of alcohol    Types: 1 Standard drinks or equivalent per week   Drug use: No   Sexual activity: Yes    Comment: N/A insurance questions  Other Topics Concern   Not on file  Social History Narrative   Works.  Moving to new apartment to  be nearer her boyfriend.     Social Drivers of Corporate investment banker Strain: Low Risk  (02/24/2024)   Overall Financial Resource Strain (CARDIA)    Difficulty of Paying Living Expenses: Not hard at all  Food Insecurity: No Food Insecurity (02/24/2024)   Hunger Vital Sign    Worried About Running Out of Food in the Last Year: Never true    Ran Out of Food in the Last Year: Never true  Transportation Needs: No Transportation Needs (02/24/2024)   PRAPARE - Administrator, Civil Service (Medical): No    Lack of Transportation (Non-Medical): No  Physical Activity: Sufficiently Active (02/24/2024)   Exercise Vital Sign    Days of Exercise per Week: 6 days    Minutes of Exercise per Session: 40 min  Stress: No  Stress Concern Present (02/24/2024)   [employer] of Occupational Health - Occupational Stress Questionnaire    Feeling of Stress: Only a little  Social Connections: Unknown (02/24/2024)   Social Connection and Isolation Panel    Frequency of Communication with Friends and Family: More than three times a week    Frequency of Social Gatherings with Friends and Family: Three times a week    Attends Religious Services: 1 to 4 times per year    Active Member of Clubs or Organizations: No    Attends Engineer, structural: Not on file    Marital Status: Patient declined  Catering manager Violence: Not on file    Family History  Problem Relation Age of Onset   Heart disease Mother    Alcohol abuse Mother    Epilepsy Sister    Depression Sister        possible suicide attempt   Epilepsy Brother    Depression Brother        with suicide attempts   Alcohol abuse Maternal Grandfather    Colon polyps Neg Hx    Colon cancer Neg Hx    Esophageal cancer Neg Hx    Rectal cancer Neg Hx    Stomach cancer Neg Hx      Review of Systems  Constitutional: Negative.  Negative for chills and fever.  HENT: Negative.  Negative for congestion and sore throat.   Respiratory: Negative.  Negative for cough and shortness of breath.   Cardiovascular: Negative.  Negative for chest pain and palpitations.  Gastrointestinal:  Negative for abdominal pain, diarrhea, nausea and vomiting.  Genitourinary: Negative.  Negative for dysuria and hematuria.  Skin: Negative.  Negative for rash.       Multiple cysts to left lower leg  Neurological: Negative.  Negative for dizziness and headaches.  All other systems reviewed and are negative.   Vitals:   02/28/24 1000  Pulse: 76  Temp: 98.3 F (36.8 C)  SpO2: 95%    Physical Exam Vitals reviewed.  Constitutional:      Appearance: Normal appearance.  HENT:     Head: Normocephalic.  Eyes:     Extraocular Movements: Extraocular movements intact.   Cardiovascular:     Rate and Rhythm: Normal rate.  Pulmonary:     Effort: Pulmonary effort is normal.  Skin:    General: Skin is warm and dry.     Comments: Left lower leg: Multiple small nontender movable sebaceous cysts.  No sign of infection.  Neurological:     Mental Status: She is alert and oriented to person, place, and time.  Psychiatric:  Mood and Affect: Mood normal.        Behavior: Behavior normal.      ASSESSMENT & PLAN: Problem List Items Addressed This Visit       Cardiovascular and Mediastinum   Essential hypertension - Primary   BP Readings from Last 3 Encounters:  08/30/23 124/78  02/24/23 130/80  09/01/22 (!) 167/87  Well-controlled hypertension Continue labetalol  200 mg twice a day and valsartan  320 mg daily Cardiovascular risks associated with hypertension discussed Diet and nutrition discussed      Relevant Orders   CBC with Differential/Platelet   Comprehensive metabolic panel with GFR   Hemoglobin A1c   Lipid panel   Urinalysis   Microalbumin / creatinine urine ratio     Musculoskeletal and Integument   Sebaceous cyst   Clinically benign multiple cysts.  Mostly left lower extremity Recommend dermatology evaluation No signs of infection.  No concerns.         Genitourinary   Stage 3b chronic kidney disease (HCC)   Chronic stable condition Advised to stay well-hydrated and avoid NSAIDs Was able to follow-up with nephrology Blood work done today Urinalysis done today      Relevant Orders   CBC with Differential/Platelet   Comprehensive metabolic panel with GFR   Hemoglobin A1c   Lipid panel   Urinalysis   Microalbumin / creatinine urine ratio     Other   Anxiety and depression   Stable. Stress management discussed       Dyslipidemia   Stable chronic condition Diet and nutrition discussed Continue rosuvastatin  10 mg daily Lipid profile done today The 10-year ASCVD risk score (Arnett DK, et al., 2019) is: 2.4%    Values used to calculate the score:     Age: 54 years     Clincally relevant sex: Female     Is Non-Hispanic African American: No     Diabetic: No     Tobacco smoker: No     Systolic Blood Pressure: 124 mmHg     Is BP treated: Yes     HDL Cholesterol: 98.5 mg/dL     Total Cholesterol: 295 mg/dL       Relevant Orders   CBC with Differential/Platelet   Comprehensive metabolic panel with GFR   Hemoglobin A1c   Lipid panel   Urinalysis   Microalbumin / creatinine urine ratio       Patient Instructions  Hypertension, Adult High blood pressure (hypertension) is when the force of blood pumping through the arteries is too strong. The arteries are the blood vessels that carry blood from the heart throughout the body. Hypertension forces the heart to work harder to pump blood and may cause arteries to become narrow or stiff. Untreated or uncontrolled hypertension can lead to a heart attack, heart failure, a stroke, kidney disease, and other problems. A blood pressure reading consists of a higher number over a lower number. Ideally, your blood pressure should be below 120/80. The first (top) number is called the systolic pressure. It is a measure of the pressure in your arteries as your heart beats. The second (bottom) number is called the diastolic pressure. It is a measure of the pressure in your arteries as the heart relaxes. What are the causes? The exact cause of this condition is not known. There are some conditions that result in high blood pressure. What increases the risk? Certain factors may make you more likely to develop high blood pressure. Some of these risk factors are under your  control, including: Smoking. Not getting enough exercise or physical activity. Being overweight. Having too much fat, sugar, calories, or salt (sodium) in your diet. Drinking too much alcohol. Other risk factors include: Having a personal history of heart disease, diabetes, high cholesterol, or  kidney disease. Stress. Having a family history of high blood pressure and high cholesterol. Having obstructive sleep apnea. Age. The risk increases with age. What are the signs or symptoms? High blood pressure may not cause symptoms. Very high blood pressure (hypertensive crisis) may cause: Headache. Fast or irregular heartbeats (palpitations). Shortness of breath. Nosebleed. Nausea and vomiting. Vision changes. Severe chest pain, dizziness, and seizures. How is this diagnosed? This condition is diagnosed by measuring your blood pressure while you are seated, with your arm resting on a flat surface, your legs uncrossed, and your feet flat on the floor. The cuff of the blood pressure monitor will be placed directly against the skin of your upper arm at the level of your heart. Blood pressure should be measured at least twice using the same arm. Certain conditions can cause a difference in blood pressure between your right and left arms. If you have a high blood pressure reading during one visit or you have normal blood pressure with other risk factors, you may be asked to: Return on a different day to have your blood pressure checked again. Monitor your blood pressure at home for 1 week or longer. If you are diagnosed with hypertension, you may have other blood or imaging tests to help your health care provider understand your overall risk for other conditions. How is this treated? This condition is treated by making healthy lifestyle changes, such as eating healthy foods, exercising more, and reducing your alcohol intake. You may be referred for counseling on a healthy diet and physical activity. Your health care provider may prescribe medicine if lifestyle changes are not enough to get your blood pressure under control and if: Your systolic blood pressure is above 130. Your diastolic blood pressure is above 80. Your personal target blood pressure may vary depending on your medical  conditions, your age, and other factors. Follow these instructions at home: Eating and drinking  Eat a diet that is high in fiber and potassium, and low in sodium, added sugar, and fat. An example of this eating plan is called the DASH diet. DASH stands for Dietary Approaches to Stop Hypertension. To eat this way: Eat plenty of fresh fruits and vegetables. Try to fill one half of your plate at each meal with fruits and vegetables. Eat whole grains, such as whole-wheat pasta, brown rice, or whole-grain bread. Fill about one fourth of your plate with whole grains. Eat or drink low-fat dairy products, such as skim milk or low-fat yogurt. Avoid fatty cuts of meat, processed or cured meats, and poultry with skin. Fill about one fourth of your plate with lean proteins, such as fish, chicken without skin, beans, eggs, or tofu. Avoid pre-made and processed foods. These tend to be higher in sodium, added sugar, and fat. Reduce your daily sodium intake. Many people with hypertension should eat less than 1,500 mg of sodium a day. Do not drink alcohol if: Your health care provider tells you not to drink. You are pregnant, may be pregnant, or are planning to become pregnant. If you drink alcohol: Limit how much you have to: 0-1 drink a day for women. 0-2 drinks a day for men. Know how much alcohol is in your drink. In the [place] one drink  equals one 12 oz bottle of beer (355 mL), one 5 oz glass of wine (148 mL), or one 1 oz glass of hard liquor (44 mL). Lifestyle  Work with your health care provider to maintain a healthy body weight or to lose weight. Ask what an ideal weight is for you. Get at least 30 minutes of exercise that causes your heart to beat faster (aerobic exercise) most days of the week. Activities may include walking, swimming, or biking. Include exercise to strengthen your muscles (resistance exercise), such as Pilates or lifting weights, as part of your weekly exercise routine. Try to do  these types of exercises for 30 minutes at least 3 days a week. Do not use any products that contain nicotine or tobacco. These products include cigarettes, chewing tobacco, and vaping devices, such as e-cigarettes. If you need help quitting, ask your health care provider. Monitor your blood pressure at home as told by your health care provider. Keep all follow-up visits. This is important. Medicines Take over-the-counter and prescription medicines only as told by your health care provider. Follow directions carefully. Blood pressure medicines must be taken as prescribed. Do not skip doses of blood pressure medicine. Doing this puts you at risk for problems and can make the medicine less effective. Ask your health care provider about side effects or reactions to medicines that you should watch for. Contact a health care provider if you: Think you are having a reaction to a medicine you are taking. Have headaches that keep coming back (recurring). Feel dizzy. Have swelling in your ankles. Have trouble with your vision. Get help right away if you: Develop a severe headache or confusion. Have unusual weakness or numbness. Feel faint. Have severe pain in your chest or abdomen. Vomit repeatedly. Have trouble breathing. These symptoms may be an emergency. Get help right away. Call 911. Do not wait to see if the symptoms will go away. Do not drive yourself to the hospital. Summary Hypertension is when the force of blood pumping through your arteries is too strong. If this condition is not controlled, it may put you at risk for serious complications. Your personal target blood pressure may vary depending on your medical conditions, your age, and other factors. For most people, a normal blood pressure is less than 120/80. Hypertension is treated with lifestyle changes, medicines, or a combination of both. Lifestyle changes include losing weight, eating a healthy, low-sodium diet, exercising more,  and limiting alcohol. This information is not intended to replace advice given to you by your health care provider. Make sure you discuss any questions you have with your health care provider. Document Revised: 04/29/2021 Document Reviewed: 04/29/2021 Elsevier Patient Education  2024 Elsevier Inc.     Emil Schaumann, MD [facility]

## 2024-02-28 NOTE — Assessment & Plan Note (Signed)
Stable.  Stress management discussed.

## 2024-02-28 NOTE — Assessment & Plan Note (Signed)
 Chronic stable condition Advised to stay well-hydrated and avoid NSAIDs Was able to follow-up with nephrology Blood work done today Urinalysis done today

## 2024-02-29 ENCOUNTER — Ambulatory Visit: Payer: Self-pay | Admitting: Emergency Medicine

## 2024-02-29 ENCOUNTER — Other Ambulatory Visit (HOSPITAL_COMMUNITY): Payer: Self-pay

## 2024-02-29 DIAGNOSIS — N1832 Chronic kidney disease, stage 3b: Secondary | ICD-10-CM

## 2024-02-29 DIAGNOSIS — R7303 Prediabetes: Secondary | ICD-10-CM

## 2024-02-29 MED ORDER — DAPAGLIFLOZIN PROPANEDIOL 10 MG PO TABS
10.0000 mg | ORAL_TABLET | Freq: Every day | ORAL | 3 refills | Status: DC
  Filled 2024-02-29: qty 90, 90d supply, fill #0

## 2024-03-01 ENCOUNTER — Other Ambulatory Visit (HOSPITAL_COMMUNITY): Payer: Self-pay

## 2024-03-01 ENCOUNTER — Telehealth (HOSPITAL_COMMUNITY): Payer: Self-pay

## 2024-03-01 NOTE — Telephone Encounter (Signed)
 Pharmacy Patient Advocate Encounter   Received notification from Pt Calls Messages that prior authorization for Dapagliflozin  10 mg tablets (Farxiga ) is required/requested.   Insurance verification completed.   The patient is insured through [employer] .   Per test claim: Product not on formulary, please consider changing therapies.

## 2024-03-01 NOTE — Telephone Encounter (Signed)
 PA request has been Received. New Encounter has been or will be created for follow up. For additional info see Pharmacy Prior Auth telephone encounter from 03/01/24.

## 2024-03-02 ENCOUNTER — Other Ambulatory Visit (HOSPITAL_COMMUNITY): Payer: Self-pay

## 2024-03-07 ENCOUNTER — Other Ambulatory Visit: Payer: Self-pay | Admitting: Emergency Medicine

## 2024-03-07 DIAGNOSIS — Z1231 Encounter for screening mammogram for malignant neoplasm of breast: Secondary | ICD-10-CM

## 2024-03-09 ENCOUNTER — Other Ambulatory Visit (HOSPITAL_COMMUNITY): Payer: Self-pay

## 2024-03-09 NOTE — Telephone Encounter (Signed)
 Okay to start PA for Jardiance  10 mg

## 2024-03-10 ENCOUNTER — Other Ambulatory Visit (HOSPITAL_COMMUNITY): Payer: Self-pay

## 2024-03-10 MED ORDER — EMPAGLIFLOZIN 10 MG PO TABS
ORAL_TABLET | ORAL | 0 refills | Status: DC
  Filled 2024-03-10: qty 30, 30d supply, fill #0

## 2024-03-10 NOTE — Telephone Encounter (Signed)
 Pharmacy Patient Advocate Encounter  Received notification from [employer] that Prior Authorization for Jardiance  10MG  tablets  has been APPROVED from 03/10/24 to 03/10/25. Ran test claim, Copay is $10 (I got her a copay card to get it to this price. If you could just please send the rx in). This test claim was processed through [facility]- copay amounts may vary at other pharmacies due to pharmacy/plan contracts, or as the patient moves through the different stages of their insurance plan.   PA #/Case ID/Reference #: [id number]

## 2024-03-10 NOTE — Telephone Encounter (Signed)
 Pharmacy Patient Advocate Encounter   Received notification from Pt Calls Messages that prior authorization for Jardiance  10MG  tablets  is required/requested.   Insurance verification completed.   The patient is insured through [employer] .   Per test claim: PA required; PA submitted to above mentioned insurance via Latent Key/confirmation #/EOC [id number] Status is pending

## 2024-03-14 ENCOUNTER — Other Ambulatory Visit: Payer: Self-pay

## 2024-03-15 ENCOUNTER — Telehealth (HOSPITAL_COMMUNITY): Payer: Self-pay

## 2024-03-15 ENCOUNTER — Other Ambulatory Visit (HOSPITAL_COMMUNITY): Payer: Self-pay

## 2024-03-15 ENCOUNTER — Other Ambulatory Visit: Payer: Self-pay | Admitting: Radiology

## 2024-03-15 ENCOUNTER — Other Ambulatory Visit: Payer: Self-pay

## 2024-03-15 ENCOUNTER — Other Ambulatory Visit: Payer: Self-pay | Admitting: Emergency Medicine

## 2024-03-15 DIAGNOSIS — N1832 Chronic kidney disease, stage 3b: Secondary | ICD-10-CM

## 2024-03-15 DIAGNOSIS — R7303 Prediabetes: Secondary | ICD-10-CM

## 2024-03-15 MED ORDER — DAPAGLIFLOZIN PROPANEDIOL 10 MG PO TABS
10.0000 mg | ORAL_TABLET | Freq: Every day | ORAL | 3 refills | Status: DC
  Filled 2024-03-15: qty 90, 90d supply, fill #0

## 2024-03-15 NOTE — Telephone Encounter (Signed)
 New prescription sent but Jardiance  prescription was sent on 02/29/2024.  Look into this.  Thanks.

## 2024-03-16 ENCOUNTER — Other Ambulatory Visit (HOSPITAL_COMMUNITY): Payer: Self-pay

## 2024-03-17 ENCOUNTER — Other Ambulatory Visit (HOSPITAL_COMMUNITY): Payer: Self-pay

## 2024-03-17 ENCOUNTER — Other Ambulatory Visit: Payer: Self-pay

## 2024-03-17 ENCOUNTER — Other Ambulatory Visit: Payer: Self-pay | Admitting: Radiology

## 2024-03-17 DIAGNOSIS — N1832 Chronic kidney disease, stage 3b: Secondary | ICD-10-CM

## 2024-03-17 DIAGNOSIS — R7303 Prediabetes: Secondary | ICD-10-CM

## 2024-03-17 MED ORDER — EMPAGLIFLOZIN 10 MG PO TABS
10.0000 mg | ORAL_TABLET | Freq: Every day | ORAL | 3 refills | Status: AC
  Filled 2024-03-17: qty 30, 30d supply, fill #0

## 2024-03-21 ENCOUNTER — Other Ambulatory Visit: Payer: Self-pay | Admitting: Emergency Medicine

## 2024-03-22 ENCOUNTER — Other Ambulatory Visit (HOSPITAL_COMMUNITY): Payer: Self-pay

## 2024-03-22 ENCOUNTER — Other Ambulatory Visit: Payer: Self-pay

## 2024-03-22 MED ORDER — VALACYCLOVIR HCL 1 G PO TABS
1000.0000 mg | ORAL_TABLET | Freq: Two times a day (BID) | ORAL | 3 refills | Status: AC
  Filled 2024-03-22: qty 20, 10d supply, fill #0
  Filled 2024-05-22: qty 20, 10d supply, fill #1

## 2024-04-17 ENCOUNTER — Other Ambulatory Visit (HOSPITAL_COMMUNITY): Payer: Self-pay

## 2024-04-17 ENCOUNTER — Other Ambulatory Visit: Payer: Self-pay

## 2024-04-25 ENCOUNTER — Ambulatory Visit
Admission: RE | Admit: 2024-04-25 | Discharge: 2024-04-25 | Disposition: A | Discharge status: Home / Self Care | Source: Ambulatory Visit | Attending: Emergency Medicine | Admitting: Emergency Medicine

## 2024-04-25 DIAGNOSIS — Z1231 Encounter for screening mammogram for malignant neoplasm of breast: Secondary | ICD-10-CM

## 2024-04-28 ENCOUNTER — Ambulatory Visit: Payer: Self-pay | Admitting: Emergency Medicine

## 2024-05-15 ENCOUNTER — Ambulatory Visit (HOSPITAL_COMMUNITY)

## 2024-05-18 ENCOUNTER — Encounter (HOSPITAL_COMMUNITY): Payer: Self-pay

## 2024-05-18 ENCOUNTER — Ambulatory Visit (HOSPITAL_COMMUNITY)
Admission: RE | Admit: 2024-05-18 | Discharge: 2024-05-18 | Disposition: A | Discharge status: Home / Self Care | Source: Ambulatory Visit | Attending: Emergency Medicine | Admitting: Emergency Medicine

## 2024-05-18 ENCOUNTER — Other Ambulatory Visit (HOSPITAL_COMMUNITY): Payer: Self-pay

## 2024-05-18 ENCOUNTER — Other Ambulatory Visit: Payer: Self-pay

## 2024-05-18 VITALS — BP 98/68 | HR 76 | Temp 98.0°F | Resp 18

## 2024-05-18 DIAGNOSIS — J019 Acute sinusitis, unspecified: Secondary | ICD-10-CM

## 2024-05-18 DIAGNOSIS — B9689 Other specified bacterial agents as the cause of diseases classified elsewhere: Secondary | ICD-10-CM

## 2024-05-18 MED ORDER — PROMETHAZINE-DM 6.25-15 MG/5ML PO SYRP
5.0000 mL | ORAL_SOLUTION | Freq: Four times a day (QID) | ORAL | 0 refills | Status: AC | PRN
  Filled 2024-05-18: qty 118, 6d supply, fill #0

## 2024-05-18 MED ORDER — AMOXICILLIN-POT CLAVULANATE 875-125 MG PO TABS
1.0000 | ORAL_TABLET | Freq: Two times a day (BID) | ORAL | 0 refills | Status: AC
  Filled 2024-05-18: qty 14, 7d supply, fill #0

## 2024-05-18 MED ORDER — FLUCONAZOLE 150 MG PO TABS
150.0000 mg | ORAL_TABLET | Freq: Every day | ORAL | 0 refills | Status: AC
  Filled 2024-05-18: qty 1, 1d supply, fill #0

## 2024-05-18 NOTE — Discharge Instructions (Addendum)
 Your symptoms are consistent with a sinus infection.  Take the Augmentin twice daily with food for the next 7 days.  If you develop any vaginal itching or symptoms of yeast vaginitis you can take the Diflucan.  Use the cough syrup as needed, do not drink alcohol or drive on this medication as it may cause drowsiness or sedation.  Sleeping with a humidifier and drinking at least 64 ounces of water daily can help loosen secretions in addition to 1200 mg of Mucinex, this is available over-the-counter.  Symptoms should improve over the next few days on antibiotics.  If no improvement or any changes return to clinic for reevaluation.

## 2024-05-18 NOTE — ED Triage Notes (Signed)
 PT reports having a sinus infection for 2 weeks and now has a cough. PT is taking OTC  with out relief.

## 2024-05-18 NOTE — ED Provider Notes (Signed)
 [facility]    CSN: [id number] Arrival date & time: 05/18/24  1031      History   Chief Complaint Chief Complaint  Patient presents with   Cough    I've had a sinus infection for the better part of 2 weeks. Now I also have a hacking cough. - Entered by patient    HPI Robin Henderson is a 58 y.o. female.   Patient presents to clinic over concern of nasal congestion and, rhinorrhea and sinus pressure for the past 2 weeks or longer.  Feels like recently the postnasal drip has gotten into her chest.  She is having a cough that is keeping her up at night.  Using over-the-counter treatment without much improvement.  Husband was sick initially.  Then she got sick, husband got better but she did not.  Has not had wheezing or shortness of breath.  Denies fevers or fatigue.  The history is provided by the patient and medical records.  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  Almost slit wrists and almost jumped off a parking deck    Patient Active Problem List   Diagnosis Date Noted   Sebaceous cyst 02/28/2024   Dyslipidemia 12/31/2021   Stage 3b chronic kidney disease (HCC) 07/03/2021   Essential hypertension 07/02/2021   Anxiety and depression    Restless legs syndrome (RLS) 05/06/2012    Past Surgical History:  Procedure Laterality Date   COLONOSCOPY  02/2021   EXTERNAL EAR SURGERY  1995   correct earring hole   HEAD & NECK SKIN LESION EXCISIONAL BIOPSY  1995   lip lesion   WISDOM TOOTH EXTRACTION      OB History     Gravida  0   Para  0   Term  0   Preterm  0   AB  0   Living  0      SAB  0   IAB  0   Ectopic  0   Multiple  0   Live Births  0            Home Medications    Prior to  Admission medications   Medication Sig Start Date End Date Taking? Authorizing Provider  amoxicillin-clavulanate (AUGMENTIN) 875-125 MG tablet Take 1 tablet by mouth every 12 (twelve) hours. 05/18/24  Yes Offie Pickron  N, FNP  fluconazole (DIFLUCAN) 150 MG tablet Take 1 tablet (150 mg total) by mouth daily. 05/18/24  Yes Kyrianna Barletta  N, FNP  labetalol  (NORMODYNE ) 200 MG tablet Take 1 tablet (200 mg total) by mouth 2 (two) times daily. 09/14/23  Yes Sagardia, Miguel Jose, MD  promethazine-dextromethorphan (PROMETHAZINE-DM) 6.25-15 MG/5ML syrup Take 5 mLs by mouth 4 (four) times daily as needed for cough. 05/18/24  Yes Yesenia Locurto  N, FNP  rosuvastatin  (CRESTOR ) 10 MG tablet Take 1 tablet (10 mg total) by mouth daily. 12/22/23  Yes Sagardia, Emil Schanz, MD  valACYclovir  (VALTREX ) 1000 MG tablet Take 1 tablet (1,000 mg total) by mouth 2 (two) times daily. 03/22/24  Yes Sagardia, Emil Schanz, MD  valsartan  (DIOVAN ) 320 MG tablet Take 1 tablet (320 mg total) by mouth daily. 11/22/23  Yes Sagardia, Emil Schanz, MD  empagliflozin  (JARDIANCE ) 10 MG TABS tablet Take 1 tablet (10 mg total) by mouth daily. 03/17/24   Purcell Emil Schanz, MD    Family History Family History  Problem Relation Age of Onset   Heart disease Mother    Alcohol abuse Mother    Epilepsy Sister    Depression Sister        possible suicide attempt   Alcohol abuse Maternal Grandfather    Epilepsy Brother    Depression Brother        with suicide attempts   Colon polyps Neg Hx    Colon cancer Neg Hx    Esophageal cancer Neg Hx    Rectal cancer Neg Hx    Stomach cancer Neg Hx    Breast cancer Neg Hx     Social History Social History   Tobacco Use   Smoking status: Never   Smokeless tobacco: Never  Vaping Use   Vaping status: Never Used  Substance Use Topics   Alcohol use: Yes    Alcohol/week: 1.0 standard drink of alcohol    Types: 1 Standard drinks or equivalent per week   Drug use: No      Allergies   Patient has no known allergies.   Review of Systems Review of Systems  Per HPI  Physical Exam Triage Vital Signs ED Triage Vitals  Encounter Vitals Group     BP 05/18/24 1113 98/68     Girls Systolic BP Percentile --      Girls Diastolic BP Percentile --      Boys Systolic BP Percentile --      Boys Diastolic BP Percentile --      Pulse Rate 05/18/24 1113 76     Resp 05/18/24 1113 18     Temp 05/18/24 1113 98 F (36.7 C)     Temp src --      SpO2 05/18/24 1113 98 %     Weight --      Height --      Head Circumference --      Peak Flow --      Pain Score 05/18/24 1110 0     Pain Loc --      Pain Education --      Exclude from Growth Chart --    No data found.  Updated Vital Signs BP 98/68   Pulse 76   Temp 98 F (36.7 C)   Resp 18   LMP 10/27/2016   SpO2 98%   Visual Acuity Right Eye Distance:   Left Eye Distance:   Bilateral Distance:    Right Eye Near:   Left Eye Near:    Bilateral Near:     Physical Exam Vitals and nursing note reviewed.  Constitutional:      Appearance: Normal appearance.  HENT:     Head: Normocephalic and atraumatic.     Right Ear: External ear normal.     Left Ear: External ear normal.     Nose: Congestion present.     Mouth/Throat:     Mouth: Mucous membranes are moist.  Eyes:     Conjunctiva/sclera: Conjunctivae normal.  Cardiovascular:     Rate and Rhythm: Normal rate and regular rhythm.     Heart sounds: Normal heart sounds. No murmur heard. Pulmonary:     Effort: Pulmonary effort is normal. No respiratory distress.     Breath sounds: Normal breath sounds. No wheezing.  Skin:    General: Skin is warm and dry.  Neurological:     General: No focal deficit present.     Mental Status: She is alert and oriented to person, place, and time.  Psychiatric:        Mood and Affect: Mood normal.        Behavior: Behavior normal.      UC Treatments / Results  Labs (all labs ordered are listed, but  only abnormal results are displayed) Labs Reviewed - No data to display  EKG   Radiology No results found.  Procedures Procedures (including critical care time)  Medications Ordered in UC Medications - No data to display  Initial Impression / Assessment and Plan / UC Course  I have reviewed the triage vital signs and the nursing notes.  Pertinent labs & imaging results that were available during my care of the patient were reviewed by me and considered in my medical decision making (see chart for details).  Vitals and triage reviewed, patient is hemodynamically stable.  Lungs vesicular, heart with regular rate and rhythm.  Congestion present as well as postnasal drip.  Symptoms consistent with bacterial sinusitis, will treat with Augmentin.  Cough management discussed.  Diflucan sent in to prevent yeast vaginitis, as patient is prone to these with antibiotic use.  Symptomatic management discussed.  Plan of care, follow-up care return precautions given, no questions at this time.    Final Clinical Impressions(s) / UC Diagnoses   Final diagnoses:  Acute bacterial sinusitis     Discharge Instructions      Your symptoms are consistent with a sinus infection.  Take the Augmentin twice daily with food for the next 7 days.  If you develop any vaginal itching or symptoms of yeast vaginitis you can take the Diflucan.  Use the cough syrup as needed, do not drink alcohol or drive on this medication as it may cause drowsiness or sedation.  Sleeping with a humidifier and drinking at least 64 ounces of water daily can help loosen secretions in addition to 1200 mg of Mucinex, this is available over-the-counter.  Symptoms should improve over the next few days on antibiotics.  If no improvement or any changes return to clinic for reevaluation.     ED Prescriptions     Medication Sig Dispense Auth. Provider   promethazine-dextromethorphan (PROMETHAZINE-DM) 6.25-15 MG/5ML syrup Take 5 mLs  by mouth 4 (four) times daily as needed for cough. 118 mL Dreama, Shashwat Cleary  N, FNP   amoxicillin-clavulanate (AUGMENTIN) 875-125 MG tablet Take 1 tablet by mouth every 12 (twelve) hours. 14 tablet Dreama, Natsha Guidry  N, FNP   fluconazole (DIFLUCAN) 150 MG tablet Take 1 tablet (150 mg total) by mouth daily. 1 tablet Dreama, Baani Bober  N, FNP      PDMP not reviewed this encounter.   Dreama Helaine SAILOR, FNP 05/18/24 1137

## 2024-05-22 ENCOUNTER — Other Ambulatory Visit (HOSPITAL_COMMUNITY): Payer: Self-pay

## 2024-05-22 ENCOUNTER — Other Ambulatory Visit: Payer: Self-pay

## 2024-06-21 ENCOUNTER — Other Ambulatory Visit: Payer: Self-pay

## 2024-06-21 ENCOUNTER — Other Ambulatory Visit (HOSPITAL_COMMUNITY): Payer: Self-pay

## 2024-06-21 ENCOUNTER — Other Ambulatory Visit: Payer: Self-pay | Admitting: Emergency Medicine

## 2024-06-21 DIAGNOSIS — I10 Essential (primary) hypertension: Secondary | ICD-10-CM

## 2024-06-21 DIAGNOSIS — E785 Hyperlipidemia, unspecified: Secondary | ICD-10-CM

## 2024-06-21 MED ORDER — LABETALOL HCL 200 MG PO TABS
200.0000 mg | ORAL_TABLET | Freq: Two times a day (BID) | ORAL | 3 refills | Status: AC
  Filled 2024-06-21: qty 60, 30d supply, fill #0

## 2024-06-21 MED ORDER — ROSUVASTATIN CALCIUM 10 MG PO TABS
10.0000 mg | ORAL_TABLET | Freq: Every day | ORAL | 1 refills | Status: AC
  Filled 2024-06-21: qty 30, 30d supply, fill #0
  Filled 2024-07-17: qty 30, 30d supply, fill #1

## 2024-07-17 ENCOUNTER — Other Ambulatory Visit (HOSPITAL_COMMUNITY): Payer: Self-pay

## 2024-07-27 ENCOUNTER — Telehealth: Payer: Self-pay

## 2024-07-27 DIAGNOSIS — L723 Sebaceous cyst: Secondary | ICD-10-CM

## 2024-07-27 NOTE — Telephone Encounter (Signed)
Please advise if this is ok to send in.Marland KitchenMarland Kitchen

## 2024-07-27 NOTE — Telephone Encounter (Signed)
 Copied from CRM #[id number]. Topic: Referral - Request for Referral >> Jul 26, 2024  3:29 PM Deaijah H wrote: Did the patient discuss referral with their provider in the last year? Yes (If No - schedule appointment) (If Yes - send message)  Appointment offered? No  Type of order/referral and detailed reason for visit: Dermatologist Cyst on legs   Preference of office, provider, location: [facility] [address] [place] [address] Phone: [phone]  Rufus Holy If referral order, have you been seen by this specialty before? No (If Yes, this issue or another issue? When? Where?  Can we respond through MyChart? Yes

## 2024-07-27 NOTE — Telephone Encounter (Signed)
 Referral has been placed.

## 2024-08-01 ENCOUNTER — Other Ambulatory Visit: Payer: Self-pay | Admitting: Vascular Surgery

## 2024-08-01 DIAGNOSIS — I701 Atherosclerosis of renal artery: Secondary | ICD-10-CM

## 2024-08-30 ENCOUNTER — Ambulatory Visit: Admitting: Emergency Medicine

## 2024-09-06 ENCOUNTER — Ambulatory Visit: Admitting: Dermatology

## 2024-09-19 ENCOUNTER — Ambulatory Visit: Admitting: Vascular Surgery

## 2024-09-19 ENCOUNTER — Ambulatory Visit (HOSPITAL_COMMUNITY)
